# Patient Record
Sex: Male | Born: 2000 | Hispanic: Yes | Marital: Single | Smoking: Never smoker
Health system: Southern US, Community
[De-identification: ages and names within clinical notes are randomized; demographics above are authoritative.]

## PROBLEM LIST (undated history)

## (undated) DIAGNOSIS — J45909 Unspecified asthma, uncomplicated: Secondary | ICD-10-CM

## (undated) HISTORY — PX: MULTIPLE TOOTH EXTRACTIONS: SHX2053

## (undated) HISTORY — DX: Unspecified asthma, uncomplicated: J45.909

---
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
## 2020-03-18 ENCOUNTER — Ambulatory Visit: Payer: Medicaid Other

## 2020-03-18 NOTE — Progress Notes (Deleted)
    SUBJECTIVE:   CHIEF COMPLAINT / HPI:   Back pain:   PERTINENT  PMH / PSH: ***  OBJECTIVE:   There were no vitals taken for this visit.  ***  ASSESSMENT/PLAN:   No problem-specific Assessment & Plan notes found for this encounter.     Sandre Kitty, MD [facility]

## 2020-06-09 ENCOUNTER — Other Ambulatory Visit: Payer: Medicaid Other

## 2020-06-09 DIAGNOSIS — Z20822 Contact with and (suspected) exposure to covid-19: Secondary | ICD-10-CM | POA: Diagnosis not present

## 2020-06-10 ENCOUNTER — Ambulatory Visit: Payer: Medicaid Other

## 2020-06-10 LAB — NOVEL CORONAVIRUS, NAA: SARS-CoV-2, NAA: NOT DETECTED

## 2020-06-10 LAB — SARS-COV-2, NAA 2 DAY TAT

## 2020-06-17 NOTE — Progress Notes (Signed)
    SUBJECTIVE:   CHIEF COMPLAINT / HPI:   Back pain: Patient reports left-sided low back pain that started about-5 ago when he was working at his job and lifted a heavy box.  Patient reports he has had the same pain before but usually it goes away after 3 days.  He reports the pain is now a little bit better than when it started.  He has not tried any home remedies to help improve the pain.  Pain is worse with working when he has to pick up boxes, pain is better with relaxing/lying down.  He denies the pain radiating.  No urinary or bladder incontinence, no numbness/tingling in lower extremities, no weakness of lower extremities.  STI screening: Patient reports he would like to be screened today for STIs as a routine check.  Denies any concerning signs/symptoms.  PERTINENT  PMH / PSH:  Refugee status, seasonal allergies   OBJECTIVE:   BP 98/62   Pulse 74   Wt 212 lb (96.2 kg)   SpO2 98%    Physical exam: General: Well-appearing patient, no apparent distress Respiratory: Clear bilaterally, comfortable work of breathing Spine/Back: Normal upon inspection/no deformity, tenderness to palpation of left paravertebral spinal musculature and quadratus lumborum, normal strength/sensation appreciated to bilateral lower extremities   ASSESSMENT/PLAN:   Strain of lumbar region Patient with muscular strain of lumbar region after lifting heavy object.  No neurological changes to suggest nerve involvement/impingement.  Normal strength of bilateral lower extremities. -Recommended 1 tablet of Tylenol 500 mg with 1 tablet of ibuprofen 200 mg 3 times daily for the next 3 days as needed for pain, inflammation -Patient given prescription for Flexeril 5 mg to be taken at night before bedtime -Patient asked to stretch lumbar/oblique region, given demonstration of stretches-follow-up as needed, for worsening symptoms  Screen for STD (sexually transmitted disease) -Patient screened today for gonorrhea,  chlamydia, RPR, HIV -Follow-up results, treat as needed     Dollene Cleveland, DO [facility]

## 2020-06-18 ENCOUNTER — Ambulatory Visit (INDEPENDENT_AMBULATORY_CARE_PROVIDER_SITE_OTHER): Payer: Medicaid Other | Admitting: Family Medicine

## 2020-06-18 ENCOUNTER — Encounter: Payer: Self-pay | Admitting: Family Medicine

## 2020-06-18 ENCOUNTER — Ambulatory Visit: Payer: Medicaid Other | Admitting: Family Medicine

## 2020-06-18 ENCOUNTER — Other Ambulatory Visit (HOSPITAL_COMMUNITY)
Admission: RE | Admit: 2020-06-18 | Discharge: 2020-06-18 | Disposition: A | Discharge status: Home / Self Care | Payer: Medicaid Other | Source: Ambulatory Visit | Attending: Family Medicine | Admitting: Family Medicine

## 2020-06-18 ENCOUNTER — Other Ambulatory Visit: Payer: Self-pay

## 2020-06-18 VITALS — BP 98/62 | HR 74 | Wt 212.0 lb

## 2020-06-18 DIAGNOSIS — Z113 Encounter for screening for infections with a predominantly sexual mode of transmission: Secondary | ICD-10-CM | POA: Diagnosis not present

## 2020-06-18 DIAGNOSIS — S39012A Strain of muscle, fascia and tendon of lower back, initial encounter: Secondary | ICD-10-CM | POA: Insufficient documentation

## 2020-06-18 HISTORY — DX: Encounter for screening for infections with a predominantly sexual mode of transmission: Z11.3

## 2020-06-18 HISTORY — DX: Strain of muscle, fascia and tendon of lower back, initial encounter: S39.012A

## 2020-06-18 MED ORDER — CYCLOBENZAPRINE HCL 5 MG PO TABS: 5.0000 mg | ORAL_TABLET | Freq: Every day | ORAL | 0 refills | Status: DC

## 2020-06-18 NOTE — Assessment & Plan Note (Addendum)
-  Patient screened today for gonorrhea, chlamydia, RPR, HIV -Follow-up results, treat as needed

## 2020-06-18 NOTE — Patient Instructions (Addendum)
Thank you for coming in to see Elijah Sosa today! Please see below to review our plan for today's visit:  1. Take Flexeril at night as needed for back pain. Use ice / heat as needed to reduce pain. Stretch your back a couple times daily. Lift with your knees. You can take Tylenol 500mg  and Ibuprofen 200mg  together 3 times daily.  2. We are checking for Sexually Transmitted Infections today. We can call with results.  Please call the clinic at [phone] if your symptoms worsen or you have any concerns. It was our pleasure to serve you!   Dr. [facility]

## 2020-06-18 NOTE — Assessment & Plan Note (Signed)
Patient with muscular strain of lumbar region after lifting heavy object.  No neurological changes to suggest nerve involvement/impingement.  Normal strength of bilateral lower extremities. -Recommended 1 tablet of Tylenol 500 mg with 1 tablet of ibuprofen 200 mg 3 times daily for the next 3 days as needed for pain, inflammation -Patient given prescription for Flexeril 5 mg to be taken at night before bedtime -Patient asked to stretch lumbar/oblique region, given demonstration of stretches-follow-up as needed, for worsening symptoms

## 2020-06-21 LAB — URINE CYTOLOGY ANCILLARY ONLY
Chlamydia: NEGATIVE
Comment: NEGATIVE
Comment: NEGATIVE
Comment: NORMAL
Neisseria Gonorrhea: NEGATIVE
Trichomonas: NEGATIVE

## 2020-06-23 LAB — HIV ANTIBODY (ROUTINE TESTING W REFLEX): HIV Screen 4th Generation wRfx: NONREACTIVE

## 2020-06-23 LAB — RPR, QUANT+TP ABS (REFLEX)
Rapid Plasma Reagin, Quant: 1:1 {titer} — ABNORMAL HIGH
T Pallidum Abs: NONREACTIVE

## 2020-06-23 LAB — RPR: RPR Ser Ql: REACTIVE — AB

## 2020-07-08 ENCOUNTER — Encounter: Payer: Self-pay | Admitting: Family Medicine

## 2020-08-26 ENCOUNTER — Ambulatory Visit (HOSPITAL_COMMUNITY)
Admission: EM | Admit: 2020-08-26 | Discharge: 2020-08-26 | Disposition: A | Discharge status: Home / Self Care | Payer: Medicaid Other | Attending: Physician Assistant | Admitting: Physician Assistant

## 2020-08-26 ENCOUNTER — Encounter (HOSPITAL_COMMUNITY): Payer: Self-pay

## 2020-08-26 ENCOUNTER — Other Ambulatory Visit: Payer: Self-pay

## 2020-08-26 DIAGNOSIS — R109 Unspecified abdominal pain: Secondary | ICD-10-CM | POA: Diagnosis not present

## 2020-08-26 DIAGNOSIS — M546 Pain in thoracic spine: Secondary | ICD-10-CM

## 2020-08-26 LAB — POCT URINALYSIS DIPSTICK, ED / UC
Bilirubin Urine: NEGATIVE
Glucose, UA: NEGATIVE mg/dL
Hgb urine dipstick: NEGATIVE
Ketones, ur: NEGATIVE mg/dL
Leukocytes,Ua: NEGATIVE
Nitrite: NEGATIVE
Protein, ur: NEGATIVE mg/dL
Specific Gravity, Urine: 1.025 (ref 1.005–1.030)
Urobilinogen, UA: 0.2 mg/dL (ref 0.0–1.0)
pH: 6.5 (ref 5.0–8.0)

## 2020-08-26 MED ORDER — DICLOFENAC SODIUM 50 MG PO TBEC: 50.0000 mg | DELAYED_RELEASE_TABLET | Freq: Two times a day (BID) | ORAL | 0 refills | Status: DC | PRN

## 2020-08-26 MED ORDER — BACLOFEN 10 MG PO TABS: 10.0000 mg | ORAL_TABLET | Freq: Two times a day (BID) | ORAL | 0 refills | Status: DC | PRN

## 2020-08-26 NOTE — ED Triage Notes (Addendum)
Pt c/o left flank pain X 2 days. Pt states it hurts when he is walking to sitting. Pt states when he takes a deep breath the pain becomes stronger.

## 2020-08-26 NOTE — Discharge Instructions (Addendum)
Your urine was normal.  I suspect you have a muscle strain.  I have called in diclofenac (Voltaren) which is like ibuprofen and you should avoid NSAIDs including aspirin, ibuprofen, naproxen while taking this medication.  I have also called in baclofen to help with muscle relaxation but this will make you sleepy she should not drive or drink alcohol with this medication as drowsiness is a common side effect.

## 2020-08-26 NOTE — ED Provider Notes (Signed)
[facility]    CSN: [id number] Arrival date & time: 08/26/20  1234      History   Chief Complaint Chief Complaint  Patient presents with  . Back Pain  . Abdominal Pain    HPI Elijah Andrae Claunch is a 20 y.o. male.   Patient presents today with a 1 day history of left-sided flank/abdominal pain.  He denies any known injury or increase in activity prior to symptom onset.  Reports pain is 10 on a 0-10 pain scale, localized to left flank with radiation towards abdomen, described as sharp, no aggravating or alleviating factors identified.  He has not tried any over-the-counter medications for symptom management.  He denies previous injury or back surgery.  He denies history of nephrolithiasis, single kidney, recent abdominal procedure, UTI.  He denies any urinary symptoms including fever, frequency, urgency, nausea, vomiting.     Past Medical History:  Diagnosis Date  . Asthma    Childhood asthma.  Has not used/needed inhalers for 4-5 years.  Did have multiple hospitalizations as young child in [place]    Patient Active Problem List   Diagnosis Date Noted  . Strain of lumbar region 06/18/2020  . Screen for STD (sexually transmitted disease) 06/18/2020  . Seasonal allergies 09/05/2019  . Viral respiratory illness 05/26/2019  . Encounter by telehealth for suspected COVID-19 05/20/2019  . Acute post-traumatic headache, not intractable 07/04/2018  . Viral gastroenteritis 09/12/2017  . Nausea without vomiting 09/12/2017  . Sleeping difficulty 01/03/2017  . Olecranon bursitis of left elbow 02/14/2016  . Hordeolum externum (stye) 11/04/2015  . Tongue, fissured 02/20/2015  . Musculoskeletal chest pain 01/06/2015  . Constipation 10/23/2014  . 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 Bacteriuria 10/14/2014  . Refugee health examination 08/04/2014  . Abdominal pain 08/04/2014  . Dysuria 08/04/2014    Past Surgical History:  Procedure Laterality Date  . MULTIPLE TOOTH  EXTRACTIONS         Home Medications    Prior to Admission medications   Medication Sig Start Date End Date Taking? Authorizing Provider  baclofen (LIORESAL) 10 MG tablet Take 1 tablet (10 mg total) by mouth 2 (two) times daily as needed for muscle spasms. 08/26/20  Yes Tomie Spizzirri, Noberto Retort, PA-C  diclofenac (VOLTAREN) 50 MG EC tablet Take 1 tablet (50 mg total) by mouth 2 (two) times daily as needed. 08/26/20  Yes Idil Maslanka K, PA-C  cetirizine (ZYRTEC) 10 MG tablet Take 1 tablet (10 mg total) by mouth daily. 11/27/19   Dahlia Byes A, NP  docusate sodium (COLACE) 100 MG capsule Take 1 capsule (100 mg total) by mouth 2 (two) times daily. 10/23/14   Tobey Grim, MD  famotidine (PEPCID) 20 MG tablet Take 1 tablet (20 mg total) by mouth 2 (two) times daily as needed (Itching, hives). 12/10/18   Ward, Chase Picket, PA-C  fluticasone (FLONASE) 50 MCG/ACT nasal spray Place 1 spray into both nostrils daily. 1 spray in each nostril every day 11/27/19   Dahlia Byes A, NP  guaiFENesin 200 MG tablet Take 1 tablet (200 mg total) by mouth every 4 (four) hours as needed for cough or to loosen phlegm. 05/26/19   Dana Allan, MD  magic mouthwash w/lidocaine SOLN Take 5 mLs by mouth 3 (three) times daily as needed for mouth pain. 09/20/15   Nani Ravens, MD  polyethylene glycol powder [facility]) powder Take 17 g by mouth 2 (two) times daily. Until daily soft stools  OTC 01/01/17   Sharilyn Sites  Judie Petit PA-C    Family History History reviewed. No pertinent family history.  Social History Social History   Tobacco Use  . Smoking status: Never Smoker  . Smokeless tobacco: Never Used     Allergies   Patient has no known allergies.   Review of Systems Review of Systems  Constitutional: Positive for activity change. Negative for appetite change, fatigue and fever.  Respiratory: Negative for cough and shortness of breath.   Cardiovascular: Negative for chest pain.  Gastrointestinal: Negative for  abdominal pain, blood in stool, constipation, diarrhea, nausea and vomiting.  Genitourinary: Positive for flank pain (left flank). Negative for difficulty urinating, dysuria, frequency, hematuria, penile discharge and urgency.  Musculoskeletal: Positive for back pain. Negative for arthralgias and myalgias.  Neurological: Negative for dizziness, light-headedness and headaches.     Physical Exam Triage Vital Signs ED Triage Vitals  Enc Vitals Group     BP 08/26/20 1414 120/78     Pulse Rate 08/26/20 1414 88     Resp 08/26/20 1414 20     Temp 08/26/20 1414 98.3 F (36.8 C)     Temp Source 08/26/20 1414 Oral     SpO2 08/26/20 1414 98 %     Weight --      Height --      Head Circumference --      Peak Flow --      Pain Score 08/26/20 1413 10     Pain Loc --      Pain Edu? --      Excl. in [place]? --    No data found.  Updated Vital Signs BP 120/78 (BP Location: Left Arm)   Pulse 88   Temp 98.3 F (36.8 C) (Oral)   Resp 20   SpO2 98%   Visual Acuity Right Eye Distance:   Left Eye Distance:   Bilateral Distance:    Right Eye Near:   Left Eye Near:    Bilateral Near:     Physical Exam Vitals reviewed.  Constitutional:      General: He is awake.     Appearance: Normal appearance. He is normal weight. He is not ill-appearing.     Comments: Very pleasant male appears stated age in no acute distress  HENT:     Head: Normocephalic and atraumatic.  Cardiovascular:     Rate and Rhythm: Normal rate and regular rhythm.     Heart sounds: No murmur heard.   Pulmonary:     Effort: Pulmonary effort is normal.     Breath sounds: Normal breath sounds. No stridor. No wheezing, rhonchi or rales.  Abdominal:     General: Bowel sounds are normal.     Palpations: Abdomen is soft.     Tenderness: There is abdominal tenderness in the left lower quadrant. There is no right CVA tenderness, left CVA tenderness, guarding or rebound.     Comments: Tenderness to palpation of left lower  quadrant with radiation to left lateral abdomen.  Musculoskeletal:     Cervical back: No tenderness or bony tenderness.     Thoracic back: Tenderness present. No spasms or bony tenderness.     Lumbar back: No tenderness or bony tenderness.  Neurological:     Mental Status: He is alert.  Psychiatric:        Behavior: Behavior is cooperative.      UC Treatments / Results  Labs (all labs ordered are listed, but only abnormal results are displayed) Labs Reviewed  POCT URINALYSIS DIPSTICK,  ED / UC    EKG   Radiology No results found.  Procedures Procedures (including critical care time)  Medications Ordered in UC Medications - No data to display  Initial Impression / Assessment and Plan / UC Course  I have reviewed the triage vital signs and the nursing notes.  Pertinent labs & imaging results that were available during my care of the patient were reviewed by me and considered in my medical decision making (see chart for details).     UA was normal with no hematuria so low suspicion for nephrolithiasis given no CVA tenderness and normal urine.  Suspect muscle strain given etiology and patient was prescribed Voltaren 50 mg twice daily with instruction to take additional NSAIDs.  He was prescribed baclofen with instruction not to drive or drink alcohol this medication as drowsiness is a common side effect.  He can use heat and rest for additional symptom relief.  Strict return precautions given to which patient expressed understanding.  Final Clinical Impressions(s) / UC Diagnoses   Final diagnoses:  Left flank pain  Acute left-sided thoracic back pain     Discharge Instructions     Your urine was normal.  I suspect you have a muscle strain.  I have called in diclofenac (Voltaren) which is like ibuprofen and you should avoid NSAIDs including aspirin, ibuprofen, naproxen while taking this medication.  I have also called in baclofen to help with muscle relaxation but this  will make you sleepy she should not drive or drink alcohol with this medication as drowsiness is a common side effect.    ED Prescriptions    Medication Sig Dispense Auth. Provider   diclofenac (VOLTAREN) 50 MG EC tablet Take 1 tablet (50 mg total) by mouth 2 (two) times daily as needed. 20 tablet Sherryann Frese K, PA-C   baclofen (LIORESAL) 10 MG tablet Take 1 tablet (10 mg total) by mouth 2 (two) times daily as needed for muscle spasms. 20 each Namita Yearwood, Noberto Retort, PA-C     PDMP not reviewed this encounter.   Jeani Hawking, PA-C 08/26/20 1513

## 2020-09-21 ENCOUNTER — Other Ambulatory Visit: Payer: Self-pay

## 2020-09-21 ENCOUNTER — Ambulatory Visit (INDEPENDENT_AMBULATORY_CARE_PROVIDER_SITE_OTHER): Payer: Medicaid Other | Admitting: Family Medicine

## 2020-09-21 VITALS — BP 114/80 | HR 96

## 2020-09-21 DIAGNOSIS — J988 Other specified respiratory disorders: Secondary | ICD-10-CM | POA: Diagnosis not present

## 2020-09-21 MED ORDER — CETIRIZINE HCL 10 MG PO TABS: 10.0000 mg | ORAL_TABLET | Freq: Every day | ORAL | 11 refills | Status: DC

## 2020-09-21 MED ORDER — FLUTICASONE PROPIONATE 50 MCG/ACT NA SUSP: 1.0000 | Freq: Every day | NASAL | 0 refills | Status: DC

## 2020-09-21 NOTE — Progress Notes (Signed)
    SUBJECTIVE:   CHIEF COMPLAINT / HPI:   Klay is a 20 yo M who presents to [facility] for the following.   Upper respiratory symptoms Symptoms of coryza, rhinorrhea, and sore throat started this past Sunday. He has a knew symptom today of headache mainly with sneezing. He has no known sick contacts. He denies any GI upset such as diarrhea or vomiting. No fever. He is not vaccinated against COVID. He has not tried to take anything for symptom relief.   PERTINENT  PMH / PSH: Hx of suspected COVID 1 year prior, Hx of seasonal allergies.   OBJECTIVE:   BP 114/80   Pulse 96   SpO2 99%   General: Appears unwell, no acute distress. Age appropriate. HEENT: Normocephalic. White sclera. patent nares w/ evidence of rhinorrhea. Normal oropharynx. Neck is supple. No lymphadenopathy.  Cardiac: RRR, normal heart sounds, no murmurs Respiratory: CTAB, normal effort  ASSESSMENT/PLAN:   Congestion of upper airway Experiencing upper respiratory symptoms for 3 days. Suspected covid with non-vaccination status. Will obtain PCR. Could very well be a viral URI that will resolve with supportive care. Also question of seasonal allergies. -COVID PCR -Flonase -Zyrtec -Plenty of fluids and rest as needed -F/u if symptoms worsen or fail to improve     Lavonda Jumbo, DO [facility]

## 2020-09-21 NOTE — Patient Instructions (Signed)
It was wonderful to see you today.  Please bring ALL of your medications with you to every visit.   Today we talked about:  You have cough, congestion and headache which is likely due to a viral illness.  We tested you for COVID and we will notify you of the results either by phone or MyChart.  In the meantime for relief you may take Tylenol and ibuprofen for headache, I will send in Flonase and Zyrtec for your congestion.  Please call the clinic at [phone] if your symptoms worsen or you have any concerns. It was our pleasure to serve you.  Dr. Salvadore Dom

## 2020-09-22 LAB — NOVEL CORONAVIRUS, NAA: SARS-CoV-2, NAA: NOT DETECTED

## 2020-09-22 LAB — SARS-COV-2, NAA 2 DAY TAT

## 2020-09-23 NOTE — Assessment & Plan Note (Addendum)
Experiencing upper respiratory symptoms for 3 days. Suspected covid with non-vaccination status. Will obtain PCR. Could very well be a viral URI that will resolve with supportive care. Also question of seasonal allergies. -COVID PCR -Flonase -Zyrtec -Plenty of fluids and rest as needed -F/u if symptoms worsen or fail to improve

## 2021-03-29 ENCOUNTER — Ambulatory Visit (HOSPITAL_COMMUNITY)
Admission: EM | Admit: 2021-03-29 | Discharge: 2021-03-29 | Disposition: A | Discharge status: Home / Self Care | Payer: Medicaid Other

## 2021-03-29 ENCOUNTER — Other Ambulatory Visit: Payer: Self-pay

## 2021-03-29 ENCOUNTER — Encounter (HOSPITAL_COMMUNITY): Payer: Self-pay

## 2021-03-29 DIAGNOSIS — J069 Acute upper respiratory infection, unspecified: Secondary | ICD-10-CM | POA: Diagnosis not present

## 2021-03-29 DIAGNOSIS — Z8709 Personal history of other diseases of the respiratory system: Secondary | ICD-10-CM | POA: Diagnosis not present

## 2021-03-29 MED ORDER — PROMETHAZINE-DM 6.25-15 MG/5ML PO SYRP: 5.0000 mL | ORAL_SOLUTION | Freq: Four times a day (QID) | ORAL | 0 refills | Status: DC | PRN

## 2021-03-29 MED ORDER — IBUPROFEN 800 MG PO TABS: 800.0000 mg | ORAL_TABLET | Freq: Three times a day (TID) | ORAL | 0 refills | Status: DC

## 2021-03-29 NOTE — Discharge Instructions (Addendum)
-  Promethazine DM cough syrup for congestion/cough. This could make you drowsy, so take at night before bed. -Mucinex during the day -You can take Tylenol up to 1000 mg 3 times daily, and ibuprofen up to 800 mg 3 times daily with food.  You can take these together, or alternate every 3-4 hours. -With a virus, you're typically contagious for 5-7 days, or as long as you're having fevers.  -Drink plenty of fluids

## 2021-03-29 NOTE — ED Triage Notes (Signed)
Pt reports fever, headache, bilateral eye pressure and nasal congestion x 1 day.

## 2021-03-29 NOTE — ED Provider Notes (Signed)
[facility]    CSN: [id number] Arrival date & time: 03/29/21  1651      History   Chief Complaint Chief Complaint  Patient presents with   Fever   Headache   Congestive Heart Failure    HPI Elijah Sosa is a 20 y.o. male presenting with viral syndrome x2 days. Medical history childhood asthma, has not required inhaler in years. Here today with mom.  Describes 2 days of subjective fevers and chills; monitored this 1 day ago and it was up to 100 F.  Also with throbbing headaches behind eyes, nasal congestion, sneezing, nonproductive cough, malaise.  Denies nausea, vomiting, diarrhea, constipation, abdominal pain; he is tolerating fluids and food.  Ibuprofen helped minimally, last dose 7 hours ago.  Denies known sick contacts.  Requesting work note for 1 week.    HPI  Past Medical History:  Diagnosis Date   Asthma    Childhood asthma.  Has not used/needed inhalers for 4-5 years.  Did have multiple hospitalizations [place] young child [place]    Patient Active Problem List   Diagnosis Date Noted   Strain of lumbar region 06/18/2020   Screen for STD (sexually transmitted disease) 06/18/2020   Seasonal allergies 09/05/2019   Congestion of upper airway 05/26/2019   Encounter by telehealth for suspected COVID-19 05/20/2019   Acute post-traumatic headache, not intractable 07/04/2018   Viral gastroenteritis 09/12/2017   Nausea without vomiting 09/12/2017   Sleeping difficulty 01/03/2017   Olecranon bursitis of left elbow 02/14/2016   Hordeolum externum (stye) 11/04/2015   Tongue, fissured 02/20/2015   Musculoskeletal chest pain 01/06/2015   Constipation 10/23/2014   Plumbism 10/23/2014   Bacteriuria 10/14/2014   Refugee health examination 08/04/2014   Abdominal pain 08/04/2014   Dysuria 08/04/2014    Past Surgical History:  Procedure Laterality Date   MULTIPLE TOOTH EXTRACTIONS         Home Medications    Prior to Admission medications    Medication Sig Start Date End Date Taking? Authorizing Provider  ibuprofen (ADVIL) 800 MG tablet Take 1 tablet (800 mg total) by mouth 3 (three) times daily. 03/29/21  Yes Rhys MartiniGraham, Cristabel Bicknell E, PA-C  promethazine-dextromethorphan (PROMETHAZINE-DM) 6.25-15 MG/5ML syrup Take 5 mLs by mouth 4 (four) times daily as needed for cough. 03/29/21  Yes Rhys MartiniGraham, Filbert Craze E, PA-C  baclofen (LIORESAL) 10 MG tablet Take 1 tablet (10 mg total) by mouth 2 (two) times daily as needed for muscle spasms. 08/26/20   Raspet, Noberto RetortErin K, PA-C  cetirizine (ZYRTEC) 10 MG tablet Take 1 tablet (10 mg total) by mouth daily. 09/21/20   Autry-Lott, Randa EvensSimone, DO  diclofenac (VOLTAREN) 50 MG EC tablet Take 1 tablet (50 mg total) by mouth 2 (two) times daily as needed. 08/26/20   Raspet, Noberto RetortErin K, PA-C  docusate sodium (COLACE) 100 MG capsule Take 1 capsule (100 mg total) by mouth 2 (two) times daily. 10/23/14   Tobey GrimWalden, Jeffrey H, MD  famotidine (PEPCID) 20 MG tablet Take 1 tablet (20 mg total) by mouth 2 (two) times daily as needed (Itching, hives). 12/10/18   Ward, Chase PicketJaime Pilcher, PA-C  fluticasone (FLONASE) 50 MCG/ACT nasal spray Place 1 spray into both nostrils daily. 1 spray in each nostril every day 09/21/20   Autry-Lott, Randa EvensSimone, DO  guaiFENesin 200 MG tablet Take 1 tablet (200 mg total) by mouth every 4 (four) hours as needed for cough or to loosen phlegm. 05/26/19   Dana AllanWalsh, Tanya, MD  magic mouthwash w/lidocaine SOLN Take 5  mLs by mouth 3 (three) times daily as needed for mouth pain. 09/20/15   Leone Brand, MD  polyethylene glycol powder [facility]) powder Take 17 g by mouth 2 (two) times daily. Until daily soft stools  OTC 01/01/17   Larene Pickett, PA-C    Family History History reviewed. No pertinent family history.  Social History Social History   Tobacco Use   Smoking status: Never   Smokeless tobacco: Never  Substance Use Topics   Alcohol use: Never   Drug use: Never     Allergies   Patient has no known  allergies.   Review of Systems Review of Systems  Constitutional:  Positive for chills. Negative for appetite change and fever.  HENT:  Positive for congestion. Negative for ear pain, rhinorrhea, sinus pressure, sinus pain and sore throat.   Eyes:  Negative for redness and visual disturbance.  Respiratory:  Positive for cough. Negative for chest tightness, shortness of breath and wheezing.   Cardiovascular:  Negative for chest pain and palpitations.  Gastrointestinal:  Negative for abdominal pain, constipation, diarrhea, nausea and vomiting.  Genitourinary:  Negative for dysuria, frequency and urgency.  Musculoskeletal:  Negative for myalgias.  Neurological:  Negative for dizziness, weakness and headaches.  Psychiatric/Behavioral:  Negative for confusion.   All other systems reviewed and are negative.   Physical Exam Triage Vital Signs ED Triage Vitals  Enc Vitals Group     BP 03/29/21 1802 133/87     Pulse Rate 03/29/21 1802 (!) 101     Resp 03/29/21 1802 18     Temp 03/29/21 1802 98.9 F (37.2 C)     Temp src --      SpO2 03/29/21 1802 95 %     Weight --      Height --      Head Circumference --      Peak Flow --      Pain Score 03/29/21 1800 5     Pain Loc --      Pain Edu? --      Excl. in [place]? --    No data found.  Updated Vital Signs BP 133/87 (BP Location: Right Arm)   Pulse (!) 101   Temp 98.9 F (37.2 C)   Resp 18   SpO2 95%   Visual Acuity Right Eye Distance:   Left Eye Distance:   Bilateral Distance:    Right Eye Near:   Left Eye Near:    Bilateral Near:     Physical Exam Vitals reviewed.  Constitutional:      General: He is not in acute distress.    Appearance: Normal appearance. He is not ill-appearing.  HENT:     Head: Normocephalic and atraumatic.     Right Ear: Tympanic membrane, ear canal and external ear normal. No tenderness. No middle ear effusion. There is no impacted cerumen. Tympanic membrane is not perforated, erythematous,  retracted or bulging.     Left Ear: Tympanic membrane, ear canal and external ear normal. No tenderness.  No middle ear effusion. There is no impacted cerumen. Tympanic membrane is not perforated, erythematous, retracted or bulging.     Nose: Nose normal. No congestion.     Mouth/Throat:     Mouth: Mucous membranes are moist.     Pharynx: Uvula midline. No oropharyngeal exudate or posterior oropharyngeal erythema.  Eyes:     Extraocular Movements: Extraocular movements intact.     Pupils: Pupils are equal, round, and reactive to light.  Cardiovascular:     Rate and Rhythm: Normal rate and regular rhythm.     Heart sounds: Normal heart sounds.  Pulmonary:     Effort: Pulmonary effort is normal.     Breath sounds: Normal breath sounds. No decreased breath sounds, wheezing, rhonchi or rales.  Abdominal:     Palpations: Abdomen is soft.     Tenderness: There is no abdominal tenderness. There is no guarding or rebound.  Lymphadenopathy:     Cervical: No cervical adenopathy.     Right cervical: No superficial cervical adenopathy.    Left cervical: No superficial cervical adenopathy.  Neurological:     General: No focal deficit present.     Mental Status: He is alert and oriented to person, place, and time.  Psychiatric:        Mood and Affect: Mood normal.        Behavior: Behavior normal.        Thought Content: Thought content normal.        Judgment: Judgment normal.     UC Treatments / Results  Labs (all labs ordered are listed, but only abnormal results are displayed) Labs Reviewed - No data to display  EKG   Radiology No results found.  Procedures Procedures (including critical care time)  Medications Ordered in UC Medications - No data to display  Initial Impression / Assessment and Plan / UC Course  I have reviewed the triage vital signs and the nursing notes.  Pertinent labs & imaging results that were available during my care of the patient were reviewed by me  and considered in my medical decision making (see chart for details).     This patient is a very pleasant 20 y.o. year old male presenting with suspected influenza. Today this pt is afebrile nontachycardic nontachypneic, oxygenating well on room air, no wheezes rhonchi or rales.   Out of tamiflu window, will defer rapid influenza testing as symptoms are mild.  History childhood asthma; this is currently well controlled on no medications and he declines albuterol inhaler today.  Promethzine DM, Mucinex, ibuprofen 800mg .   ED return precautions discussed. Patient and mom verbalizes understanding and agreement.   Final Clinical Impressions(s) / UC Diagnoses   Final diagnoses:  Viral URI with cough  History of asthma     Discharge Instructions      -Promethazine DM cough syrup for congestion/cough. This could make you drowsy, so take at night before bed. -Mucinex during the day -You can take Tylenol up to 1000 mg 3 times daily, and ibuprofen up to 800 mg 3 times daily with food.  You can take these together, or alternate every 3-4 hours. -With a virus, you're typically contagious for 5-7 days, or as long as you're having fevers.  -Drink plenty of fluids     ED Prescriptions     Medication Sig Dispense Auth. Provider   promethazine-dextromethorphan (PROMETHAZINE-DM) 6.25-15 MG/5ML syrup Take 5 mLs by mouth 4 (four) times daily as needed for cough. 118 mL 07-17-1990, PA-C   ibuprofen (ADVIL) 800 MG tablet Take 1 tablet (800 mg total) by mouth 3 (three) times daily. 21 tablet Rhys Martini, PA-C      PDMP not reviewed this encounter.   Rhys Martini, PA-C 03/29/21 1910

## 2021-11-29 IMAGING — CR DG CHEST 2V
2 series · 2 of 2 positions shown · non-contrast
Comparison: None.

CLINICAL DATA: Shortness of breath with chest pain

EXAM:
CHEST - 2 VIEW

[chest pa]
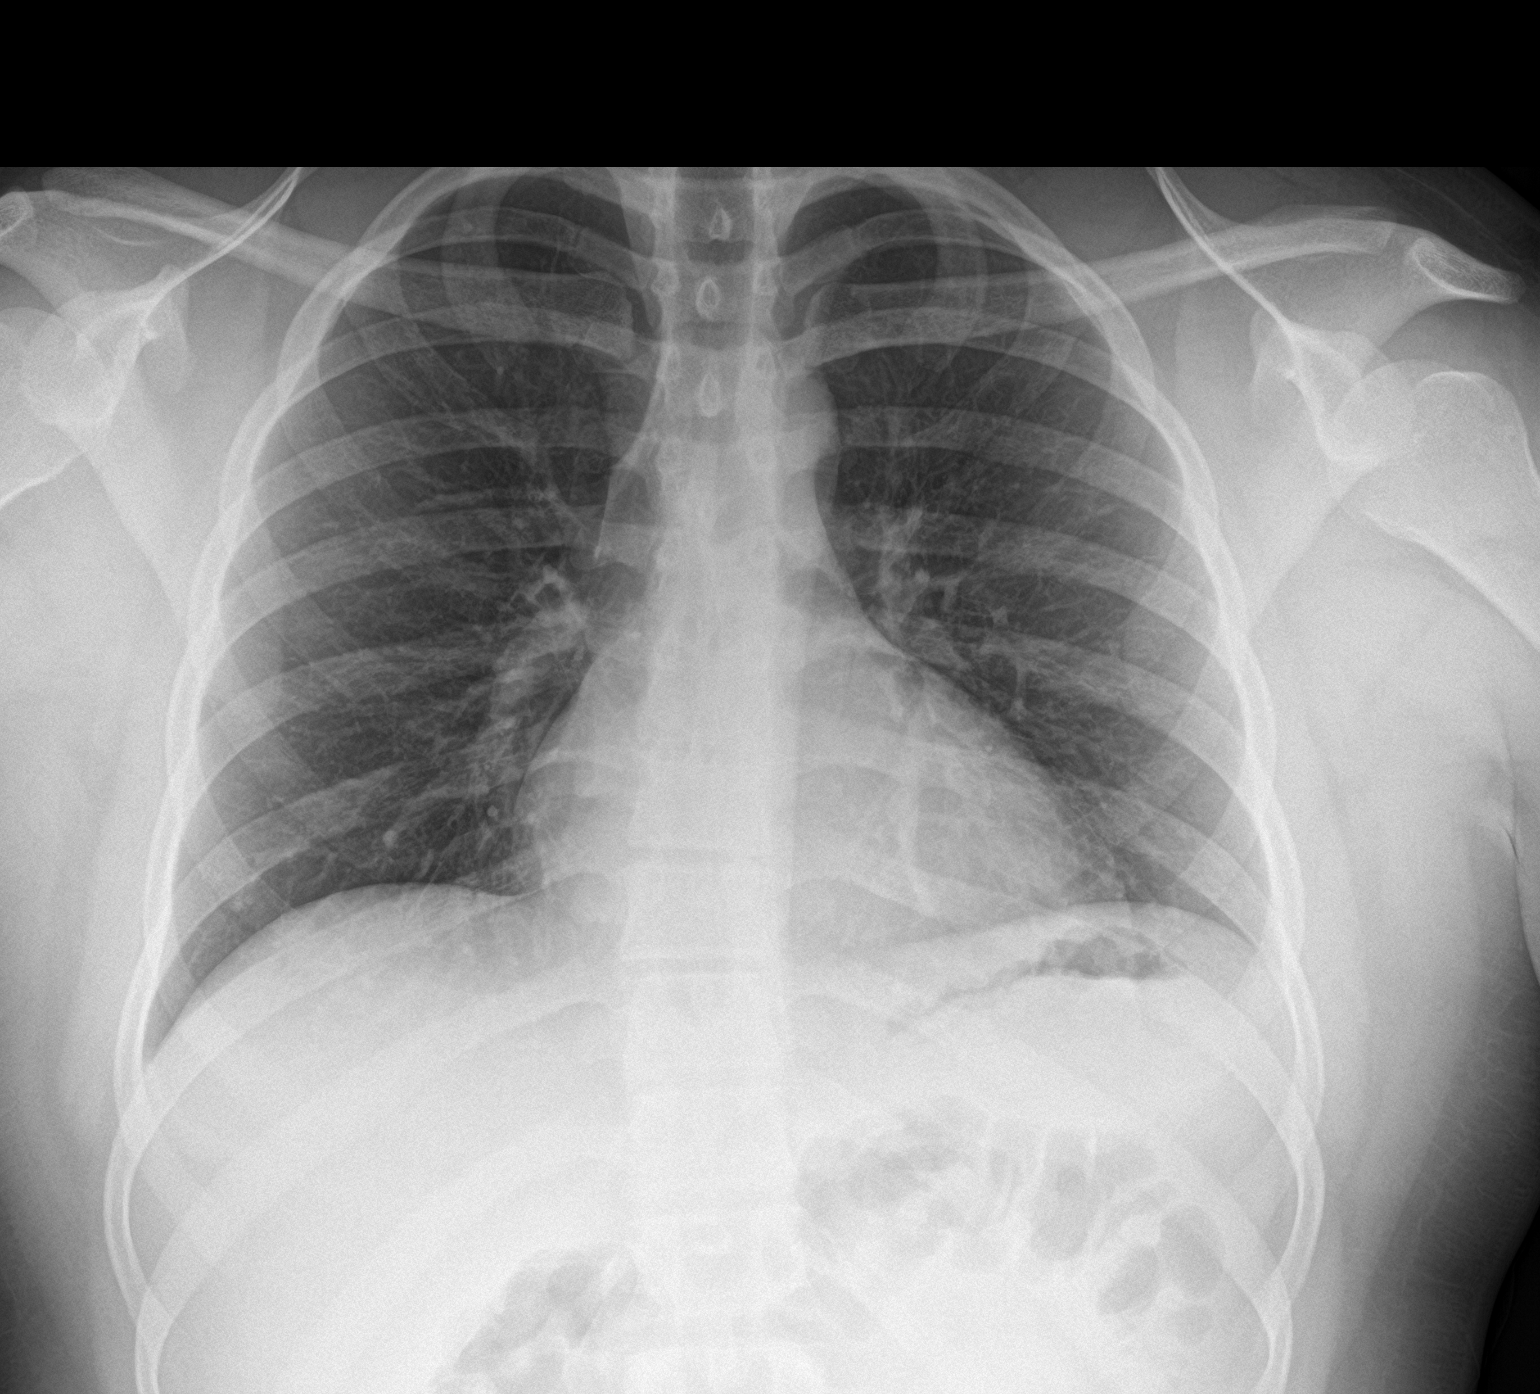

[chest lat]
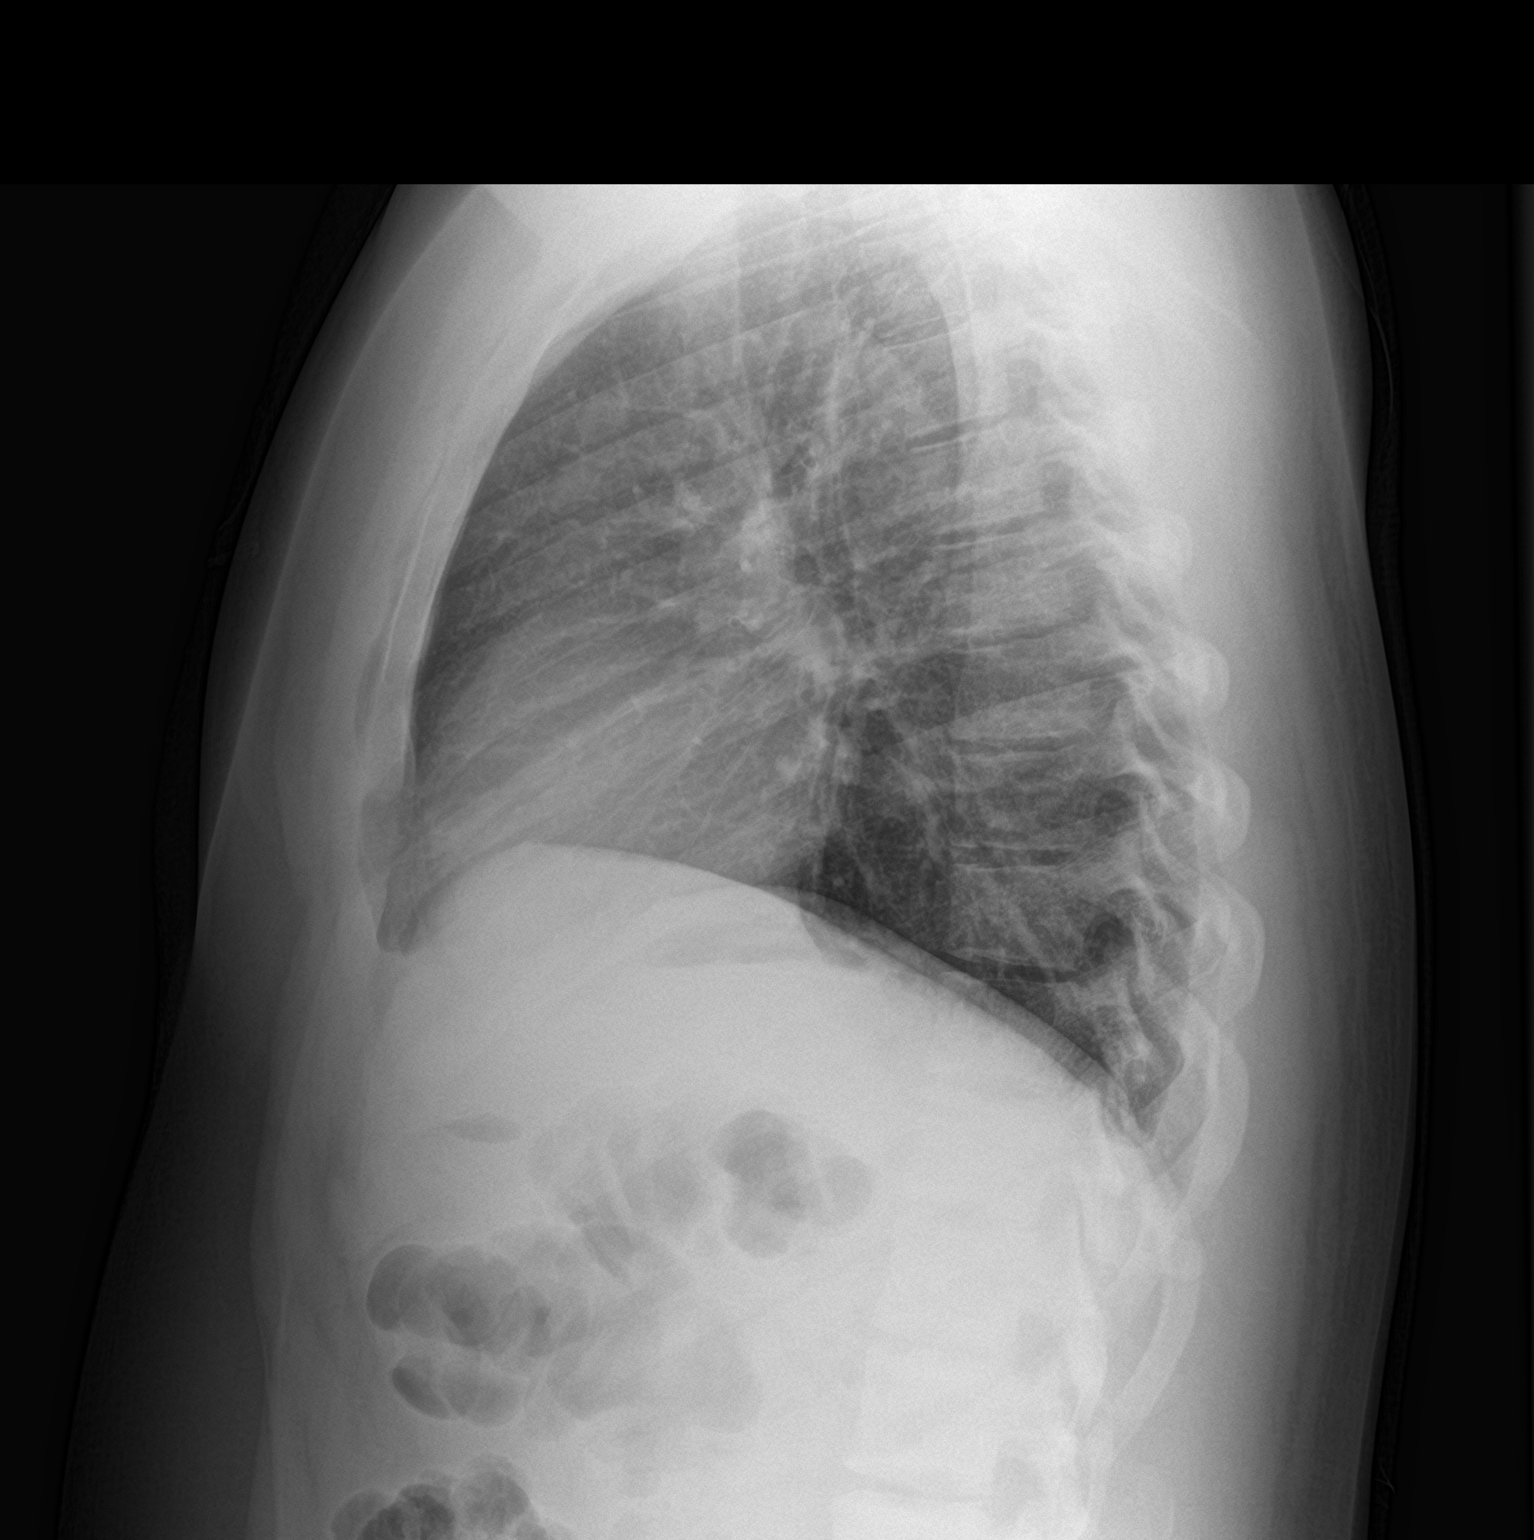

[2 of 2 positions shown; findings below may reference images not displayed]

FINDINGS: The heart size and mediastinal contours are within normal limits.
Both lungs are clear. The visualized skeletal structures are
unremarkable.
IMPRESSION: No active cardiopulmonary disease.

## 2021-12-26 ENCOUNTER — Ambulatory Visit: Payer: Medicaid Other | Admitting: Family Medicine

## 2022-04-27 ENCOUNTER — Ambulatory Visit (INDEPENDENT_AMBULATORY_CARE_PROVIDER_SITE_OTHER): Payer: Medicaid Other | Admitting: Student

## 2022-04-27 VITALS — BP 120/84 | HR 94 | Temp 98.8°F | Ht 66.0 in | Wt 221.4 lb

## 2022-04-27 DIAGNOSIS — J069 Acute upper respiratory infection, unspecified: Secondary | ICD-10-CM

## 2022-04-27 DIAGNOSIS — J302 Other seasonal allergic rhinitis: Secondary | ICD-10-CM

## 2022-04-27 DIAGNOSIS — J988 Other specified respiratory disorders: Secondary | ICD-10-CM | POA: Diagnosis not present

## 2022-04-27 HISTORY — DX: Acute upper respiratory infection, unspecified: J06.9

## 2022-04-27 MED ORDER — CETIRIZINE HCL 10 MG PO TABS: 10.0000 mg | ORAL_TABLET | Freq: Every day | ORAL | 11 refills | Status: DC

## 2022-04-27 NOTE — Assessment & Plan Note (Signed)
Zyrtec refilled for possible allergies which could be causing his watery itchy eyes although they are not watery or itchy today.

## 2022-04-27 NOTE — Assessment & Plan Note (Signed)
Symptoms most consistent with viral URI. Pt is afebrile with clear lung sounds and breathing comfortably on room air. Although it will not change management he would like COVID and flu testing.  -COVID and flu testing sent out -Handout on URI was given which included supportive care and return precautions which were also discussed

## 2022-04-27 NOTE — Patient Instructions (Signed)
It was great to see you! Thank you for allowing me to participate in your care!   Our plans for today:  -For your itchy watery eyes I sent in a prescription for Zyrtec which you can take once a day.  If this does not help, please let us know. -Today, I believe you have an upper respiratory infection.  Please see the handout on upper respiratory infections, supportive care, and when to return if needed. -We have sent out a test for flu and COVID.  We are checking some labs today, I will call you if they are abnormal will send you a MyChart message or a letter if they are normal.  If you do not hear about your labs in the next 2 weeks please let us know.  Take care and seek immediate care sooner if you develop any concerns.   Dr. Erick Alley, DO [facility]

## 2022-04-27 NOTE — Progress Notes (Signed)
    SUBJECTIVE:   CHIEF COMPLAINT / HPI:   Cold like symptoms Symptoms started about 2 days ago with sore throat, headache, rhinorrhea, eye pain, dry cough. No fever, no decreased appetite, no body aches. He has taken ibuprofen once yesterday. No known sick contacts.   Watery itching eyes Pt states he occasionally has watery itchy eyes for several years. They are more watery when its cold outside.   PERTINENT  PMH / PSH: None  OBJECTIVE:   BP 120/84   Pulse 94   Temp 98.8 F (37.1 C)   Ht 5\' 6"  (1.676 m)   Wt 221 lb 6.4 oz (100.4 kg)   SpO2 97%   BMI 35.73 kg/m    General: NAD, pleasant, able to participate in exam HEENT: White sclera, clear conjunctiva, MMM, no erythema or exudate of nasopharynx Cardiac: RRR, no murmurs. Respiratory: CTAB, normal effort, No wheezes, rales or rhonchi Skin: warm and dry Neuro: alert, no obvious focal deficits Psych: Normal affect and mood  ASSESSMENT/PLAN:   Upper respiratory tract infection Symptoms most consistent with viral URI. Pt is afebrile with clear lung sounds and breathing comfortably on room air. Although it will not change management he would like COVID and flu testing.  -COVID and flu testing sent out -Handout on URI was given which included supportive care and return precautions which were also discussed   Seasonal allergies Zyrtec refilled for possible allergies which could be causing his watery itchy eyes although they are not watery or itchy today.    Dr. , DO [facility]

## 2022-04-29 LAB — COVID-19, FLU A+B NAA
Influenza A, NAA: NOT DETECTED
Influenza B, NAA: NOT DETECTED
SARS-CoV-2, NAA: NOT DETECTED

## 2022-05-01 ENCOUNTER — Encounter: Payer: Self-pay | Admitting: Student

## 2022-05-29 ENCOUNTER — Ambulatory Visit: Payer: Medicaid Other | Admitting: Family Medicine

## 2022-05-29 ENCOUNTER — Encounter: Payer: Self-pay | Admitting: Student

## 2022-05-29 ENCOUNTER — Ambulatory Visit: Payer: Medicaid Other | Admitting: Student

## 2022-05-29 VITALS — BP 106/64 | HR 102 | Temp 100.0°F | Wt 223.2 lb

## 2022-05-29 DIAGNOSIS — J029 Acute pharyngitis, unspecified: Secondary | ICD-10-CM

## 2022-05-29 DIAGNOSIS — R509 Fever, unspecified: Secondary | ICD-10-CM | POA: Diagnosis not present

## 2022-05-29 LAB — POC SOFIA 2 FLU + SARS ANTIGEN FIA
Influenza A, POC: NEGATIVE
Influenza B, POC: NEGATIVE
SARS Coronavirus 2 Ag: NEGATIVE

## 2022-05-29 NOTE — Patient Instructions (Signed)
It was wonderful to meet you today. Thank you for allowing me to be a part of your care. Below is a short summary of what we discussed at your visit today:  Your symptoms are more consistent with viral upper respiratory infection.  Your COVID and flu test today was negative.  I recommend tylenol or ibuprofen for fever.  Make sure you stay hydrated with water or Gatorade.   Please bring all of your medications to every appointment!  If you have any questions or concerns, please do not hesitate to contact us via phone or MyChart message.   Alen Bleacher, MD [facility]

## 2022-05-29 NOTE — Progress Notes (Signed)
    SUBJECTIVE:   CHIEF COMPLAINT / HPI:   Patient is a 22 year old male presenting today with possible upper respiratory infection. Per patient symptom started with chills and sore throat yesterday.  Reports low grade fever treated with tylenol which provided no relieve.  Associated symptoms include fatigue and body ache. No cough, or running nose He has had decreased appetite but drinking adequately. No known recent sick contact. No ear pain, vomiting or diarrhea.   PERTINENT  PMH / PSH: Reviewed   OBJECTIVE:   BP 106/64   Pulse (!) 102   Temp 100 F (37.8 C) (Oral)   Wt 223 lb 3.2 oz (101.2 kg)   SpO2 99%   BMI 36.03 kg/m    Physical Exam General: Alert, well appearing, NAD HEENT: Significant oropharyngeal erythema, no tonsillar exudate or cervical lymphadenopathy. Cardiovascular: RRR, No Murmurs, Normal S2/S2 Respiratory: CTAB, No wheezing or Rales Extremities: No edema on extremities     ASSESSMENT/PLAN:  Viral illness 22 y.o. year old presents with fever, body ache and sore throat. He has mildly elevated temp today and on exam has oropharyngeal erythema,  good work of breathing on RA and clear breath sounds bilaterally.  His flu and COVID test were negative.  Overall presentation and exam is consistent with viral URI.   - Discussed conservative management with warm water, honey and lemon. - Recommended tylenol or ibuprofen for fever.  - Encouraged adequate hydration for patient.  - Outline signs and symptoms that will warrant ED visit or return for further assessment.     Alen Bleacher, MD [place]

## 2022-07-19 ENCOUNTER — Ambulatory Visit: Payer: Medicaid Other | Admitting: Family Medicine

## 2022-07-19 VITALS — BP 137/84 | HR 88 | Wt 226.2 lb

## 2022-07-19 DIAGNOSIS — R109 Unspecified abdominal pain: Secondary | ICD-10-CM | POA: Diagnosis not present

## 2022-07-19 MED ORDER — DICLOFENAC SODIUM 1 % EX GEL: 2.0000 g | Freq: Four times a day (QID) | CUTANEOUS | 0 refills | Status: DC | PRN

## 2022-07-19 NOTE — Progress Notes (Signed)
  Date of Visit: 07/19/2022   SUBJECTIVE:   HPI:  Elijah Sosa presents today for a same day appointment to discuss abdominal pain.  For the last 2 days has noted a sharp stabbing pain on the left side of his abdomen.  It was there all day yesterday and noticed it was there again today.  It did not bother him overnight.  He is stooling and urinating normally.  Denies having any blood in his stool, straining, or hard stools.  Denies nausea or vomiting.  Eating and drinking well.  He did have some diarrhea today.  He did note this morning that when he very lightly touched the skin of his upper left abdomen that the pain intensified  PMHx: Seasonal allergies, refugee from [place], remote history of constipation  OBJECTIVE:   BP 137/84   Pulse 88   Wt 226 lb 3.2 oz (102.6 kg)   SpO2 97%   BMI 36.51 kg/m  Gen: No acute distress, pleasant, cooperative, well-appearing HEENT: Normocephalic, atraumatic, moist mucous membranes Heart: Regular rate and rhythm, no murmur Lungs: Clear bilaterally, normal respiratory effort Abdomen: Normoactive bowel sounds.  Soft, nontender to palpation in all quadrants.  No pain elicited at all on exam.  No organomegaly.  No peritoneal signs Neuro: Alert, grossly nonfocal, speech normal  ASSESSMENT/PLAN:   Abdominal pain Overall benign exam today.  Patient is well-appearing and tolerating p.o.  Discussed possible etiologies with him.  Given that the pain was worsened with very light touch of his skin earlier today, favor possible nerve/skin irritation.  I do wonder if this could be an early presentation of shingles.  Advised he should come back in if he develops a rash.  In the meantime, treat with topical NSAID.  Voltaren gel prescribed.  Follow-up if worsening or not improving.  [place]. Ardelia Mems, [place]

## 2022-07-19 NOTE — Patient Instructions (Signed)
It was nice to meet you today.  Come back if you notice a rash or if it is getting worse  Try cream to help with pain  Be well, Dr. Ardelia Mems

## 2022-10-22 ENCOUNTER — Encounter (HOSPITAL_COMMUNITY): Payer: Self-pay | Admitting: Pharmacy Technician

## 2022-10-22 ENCOUNTER — Other Ambulatory Visit: Payer: Self-pay

## 2022-10-22 ENCOUNTER — Emergency Department (HOSPITAL_COMMUNITY)
Admission: EM | Admit: 2022-10-22 | Discharge: 2022-10-22 | Disposition: A | Discharge status: Home / Self Care | Payer: Medicaid Other | Attending: Emergency Medicine | Admitting: Emergency Medicine

## 2022-10-22 DIAGNOSIS — L03213 Periorbital cellulitis: Secondary | ICD-10-CM | POA: Insufficient documentation

## 2022-10-22 DIAGNOSIS — H579 Unspecified disorder of eye and adnexa: Secondary | ICD-10-CM | POA: Diagnosis present

## 2022-10-22 DIAGNOSIS — H00011 Hordeolum externum right upper eyelid: Secondary | ICD-10-CM | POA: Diagnosis not present

## 2022-10-22 MED ORDER — ERYTHROMYCIN 5 MG/GM OP OINT: TOPICAL_OINTMENT | OPHTHALMIC | 0 refills | Status: DC

## 2022-10-22 MED ORDER — AMOXICILLIN-POT CLAVULANATE 875-125 MG PO TABS: 1.0000 | ORAL_TABLET | Freq: Two times a day (BID) | ORAL | 0 refills | Status: DC

## 2022-10-22 MED ORDER — IBUPROFEN 400 MG PO TABS
600.0000 mg | ORAL_TABLET | Freq: Once | ORAL | Status: AC
  Administered 2022-10-22: 600 mg via ORAL
  Filled 2022-10-22: qty 1

## 2022-10-22 NOTE — ED Provider Notes (Signed)
[facility] AT [facility] Provider Note   CSN: [id number] Arrival date & time: 10/22/22  1409     History  Chief Complaint  Patient presents with   Eye Problem    Elijah Sosa is a 22 y.o. male.  Patient presents with right upper eyelid swelling for the past few days.  Painful to touch.  Patient developed headache yesterday.  No neurologic signs or symptoms.  No pain deep within the eyeball itself.  No injuries.  No fevers or chills.  No change or loss of vision.  Minimal drainage.       Home Medications Prior to Admission medications   Medication Sig Start Date End Date Taking? Authorizing Provider  amoxicillin-clavulanate (AUGMENTIN) 875-125 MG tablet Take 1 tablet by mouth every 12 (twelve) hours. 10/22/22  Yes Blane Ohara, MD  erythromycin ophthalmic ointment Place a 1/2 inch ribbon of ointment into the lower eyelid twice daily for 1 week right side 10/22/22  Yes Blane Ohara, MD  diclofenac Sodium (VOLTAREN) 1 % GEL Apply 2 g topically 4 (four) times daily as needed (pain). 07/19/22   Latrelle Dodrill, MD      Allergies    Patient has no known allergies.    Review of Systems   Review of Systems  Constitutional:  Negative for chills and fever.  HENT:  Negative for congestion.   Eyes:  Positive for discharge. Negative for redness and visual disturbance.  Respiratory:  Negative for shortness of breath.   Cardiovascular:  Negative for chest pain.  Gastrointestinal:  Negative for abdominal pain and vomiting.  Genitourinary:  Negative for dysuria and flank pain.  Musculoskeletal:  Negative for back pain, neck pain and neck stiffness.  Skin:  Negative for rash.  Neurological:  Positive for headaches. Negative for light-headedness.    Physical Exam Updated Vital Signs BP 119/72 (BP Location: Right Arm)   Pulse 77   Temp 98.2 F (36.8 C)   Resp 16   SpO2 99%  Physical Exam Vitals and nursing note reviewed.   Constitutional:      General: He is not in acute distress.    Appearance: He is well-developed.  HENT:     Head: Normocephalic.     Comments: Patient has mild swelling tenderness right upper eyelid lateral aspect, no significant warmth/erythema and no induration.  No pain with full extraocular muscle function.  Pupils equal bilateral.  Overall conjunctiva clear.  No foreign body visualized.    Mouth/Throat:     Mouth: Mucous membranes are moist.  Eyes:     General:        Right eye: No discharge.        Left eye: No discharge.     Conjunctiva/sclera: Conjunctivae normal.  Neck:     Trachea: No tracheal deviation.  Cardiovascular:     Rate and Rhythm: Normal rate.  Pulmonary:     Effort: Pulmonary effort is normal.  Abdominal:     General: There is no distension.     Palpations: Abdomen is soft.     Tenderness: There is no abdominal tenderness. There is no guarding.  Musculoskeletal:     Cervical back: Normal range of motion and neck supple. No rigidity.  Skin:    General: Skin is warm.     Capillary Refill: Capillary refill takes less than 2 seconds.     Findings: No rash.  Neurological:     General: No focal deficit present.  Mental Status: He is alert.     Cranial Nerves: No cranial nerve deficit.  Psychiatric:        Mood and Affect: Mood normal.     ED Results / Procedures / Treatments   Labs (all labs ordered are listed, but only abnormal results are displayed) Labs Reviewed - No data to display  EKG None  Radiology No results found.  Procedures Procedures    Medications Ordered in ED Medications  ibuprofen (ADVIL) tablet 600 mg (600 mg Oral Given 10/22/22 1635)    ED Course/ Medical Decision Making/ A&P                             Medical Decision Making Risk Prescription drug management.   Patient presents with right upper eyelid swelling discussed differential including hordeolum, preseptal cellulitis.  No evidence of orbital cellulitis at  this time.  No indication for CT scan or blood work.  Patient well-appearing otherwise.  Discussed follow-up with eye doctor, topical and oral antibiotics and reassessment.  Spanish interpreter used during entire exam and discussion.  Patient comfortable plan.        Final Clinical Impression(s) / ED Diagnoses Final diagnoses:  Preseptal cellulitis of right eye  Hordeolum externum of right upper eyelid    Rx / DC Orders ED Discharge Orders          Ordered    amoxicillin-clavulanate (AUGMENTIN) 875-125 MG tablet  Every 12 hours        10/22/22 1632    erythromycin ophthalmic ointment        10/22/22 1632              Blane Ohara, MD 10/22/22 1639

## 2022-10-22 NOTE — ED Triage Notes (Signed)
Pt here with R eye swelling for the last few days. States painful as well. Yesterday developed a headache.

## 2022-10-22 NOTE — Discharge Instructions (Signed)
Take oral antibiotics and apply topical antibiotics twice daily for 1 week. Follow-up with eye doctor in the next week. Use Tylenol every 4 hours and ibuprofen every 6 hours as needed for pain. Return for new or worsening signs or symptoms.

## 2023-01-17 ENCOUNTER — Encounter (HOSPITAL_COMMUNITY): Payer: Self-pay | Admitting: Pharmacy Technician

## 2023-01-30 ENCOUNTER — Ambulatory Visit (INDEPENDENT_AMBULATORY_CARE_PROVIDER_SITE_OTHER): Payer: Medicaid Other | Admitting: Family Medicine

## 2023-01-30 ENCOUNTER — Encounter: Payer: Self-pay | Admitting: Family Medicine

## 2023-01-30 VITALS — BP 123/77 | HR 76 | Ht 66.0 in | Wt 234.2 lb

## 2023-01-30 DIAGNOSIS — E669 Obesity, unspecified: Secondary | ICD-10-CM

## 2023-01-30 DIAGNOSIS — R109 Unspecified abdominal pain: Secondary | ICD-10-CM

## 2023-01-30 NOTE — Progress Notes (Signed)
    SUBJECTIVE:   CHIEF COMPLAINT / HPI:   Stomach pain -Has pain after eating -Has not identified specific foods -Ongoing for months -Not getting better, not getting worse -Pain is center of stomach -Describes pain as sore -No nausea or vomiting -No radiation -No constipation or diarrhea -Has not tried any medicines -Pain typically start 5 minutes after eating -Occasionally relieved by having bowel movements -No blood in bowel movements  Weight loss -Eating lots of fried foods -Has daily soda -Concerned about his health  PERTINENT  PMH / PSH: Reviewed.  OBJECTIVE:   BP 123/77   Pulse 76   Ht 5\' 6"  (1.676 m)   Wt 234 lb 4 oz (106.3 kg)   SpO2 100%   BMI 37.81 kg/m   General: NAD  Neuro: A&O Cardiovascular: RRR, no murmurs, no peripheral edema Respiratory: normal WOB on RA, CTAB, no wheezes, ronchi or rales Abdomen: soft, TTP in LLQ, non palpable spleen or liver, no rebound or guarding Extremities: Moving all 4 extremities equally   ASSESSMENT/PLAN:   Assessment & Plan Abdominal pain, unspecified abdominal location Patient's presentation and history is fairly nonspecific for clear cause of abdominal pain.  He reassuringly has an overall benign exam today.  I suspect that given his pain is relation to food intake and bowel movements that he may have IBS.  I have low suspicion for inflammatory bowel disorders, autoimmune disorders, or cancer as patient has had weight gain not weight loss.  May be component of GERD to patient's pain however would be uncharacteristic presentation.  Counseled on appropriate dietary modifications and will trial Tums as needed after meals.  Discussed following up in 1 month if pain is not improving.  Consider ordering celiac panel, H. pylori at that time. Obesity (BMI 35.0-39.9 without comorbidity) Several red flags identified in diet including heavy amount of fried foods and daily nondiet soda intake.  Discussed making small changes to  diet for slow steady progression and weight loss.  Discussed reducing or replacing nondiet soda first, to which patient is agreeable.  Open to discussing diet further with nutritionist.  Referred to Dr. Gerilyn Pilgrim.  Return if symptoms worsen or fail to improve.  Celine Mans, MD [facility]

## 2023-01-30 NOTE — Patient Instructions (Addendum)
It was great to see you! Thank you for allowing me to participate in your care!  Our plans for today:  -Please increase amount of fiber in your diet, foods that have lots of fiber include vegetables, fruits, beans.  You may also take fiber supplements such as fiber 1 bars or Gummies. -Please try using the over-the-counter medication times if you start to have pain after you eat. -If these changes are not helping you improve your abdominal pain, please follow-up in 1 month.  -Please continue to work on reducing the amount of soda in your diet.  As this can be a significant source of excess calories.  I have made a referral to our nutritionist.  You may call her at [phone].  Please arrive 15 minutes PRIOR to your next scheduled appointment time! If you do not, this affects OTHER patients' care.  Take care and seek immediate care sooner if you develop any concerns.   Celine Mans, MD, PGY-2 [facility] 4:31 PM 01/30/2023  [facility]

## 2023-01-31 NOTE — Addendum Note (Signed)
Addended by: Caro Laroche on: 01/31/2023 10:08 AM   Modules accepted: Orders

## 2023-02-14 ENCOUNTER — Ambulatory Visit (HOSPITAL_COMMUNITY)
Admission: EM | Admit: 2023-02-14 | Discharge: 2023-02-14 | Disposition: A | Discharge status: Home / Self Care | Payer: Medicaid Other | Attending: Internal Medicine | Admitting: Internal Medicine

## 2023-02-14 DIAGNOSIS — J069 Acute upper respiratory infection, unspecified: Secondary | ICD-10-CM | POA: Diagnosis not present

## 2023-02-14 DIAGNOSIS — Z1152 Encounter for screening for COVID-19: Secondary | ICD-10-CM | POA: Insufficient documentation

## 2023-02-14 DIAGNOSIS — R059 Cough, unspecified: Secondary | ICD-10-CM | POA: Insufficient documentation

## 2023-02-14 LAB — POCT RAPID STREP A (OFFICE): Rapid Strep A Screen: NEGATIVE

## 2023-02-14 LAB — SARS CORONAVIRUS 2 (TAT 6-24 HRS): SARS Coronavirus 2: NEGATIVE

## 2023-02-14 MED ORDER — IBUPROFEN 600 MG PO TABS: 600.0000 mg | ORAL_TABLET | Freq: Four times a day (QID) | ORAL | 0 refills | Status: DC | PRN

## 2023-02-14 MED ORDER — GUAIFENESIN ER 1200 MG PO TB12: 1200.0000 mg | ORAL_TABLET | Freq: Two times a day (BID) | ORAL | 0 refills | Status: DC

## 2023-02-14 NOTE — ED Triage Notes (Signed)
Pt is here for sore throat, headache and lower back pain x 2 days. Pt is not taken any medication to help.

## 2023-02-14 NOTE — Discharge Instructions (Signed)
You have a viral illness which will improve on its own with rest, fluids, and medications to help with your symptoms. COVID testing is pending, staff will call you if this is positive. Wear a mask for 5 days of symptoms while you are in public, then you may remove your mask. You may go back to work if you do not have a fever for 24 hours without any medicines.  We discussed prescriptions that may help with your symptoms: guaifenesin You may use over the counter medicines as needed: tylenol/motrin, mucinex, zyrtec, Flonase Two teaspoons of honey in 1 cup of warm water every 4-6 hours may help with throat pains. Humidifier in room at nighttime may help soothe cough (clean well daily).   For chest pain, shortness of breath, inability to keep food or fluids down without vomiting, fever that does not respond to tylenol or motrin, or any other severe symptoms, please go to the ER for further evaluation. Return to urgent care as needed, otherwise follow-up with PCP.

## 2023-02-14 NOTE — ED Provider Notes (Signed)
[facility]    CSN: [id number] Arrival date & time: 02/14/23  1144      History   Chief Complaint Chief Complaint  Patient presents with   Sore Throat   Headache   Back Pain    HPI Elijah Sosa is a 22 y.o. male.   Patient presents to urgent care for evaluation of sore throat, nasal congestion, and generalized headache that started 2 days ago.  Sore throat is worsened by swallowing.  Headache is generalized and currently mild.  No dizziness, nausea, vomiting, cough, fevers, chills, shortness of breath, chest pain, heart palpitations, rash.  Sore throat has improved significantly since onset 2 days ago.  No recent known sick contacts with similar symptoms.  History of asthma as a child but has not suffered from this as an adult.  Non-smoker, denies drug use.  He has not used any over-the-counter medications to help with symptoms PTA.    Sore Throat Associated symptoms include headaches.  Headache Associated symptoms: back pain   Back Pain Associated symptoms: headaches     Past Medical History:  Diagnosis Date   Asthma    Childhood asthma.  Has not used/needed inhalers for 4-5 years.  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 Authorizing Provider   Guaifenesin 1200 MG TB12 Take 1 tablet (1,200 mg total) by mouth in the morning and at bedtime. 02/14/23  Yes Carlisle Beers, FNP  ibuprofen (ADVIL) 600 MG tablet Take 1 tablet (600 mg total) by mouth every 6 (six) hours as needed. 02/14/23  Yes Carlisle Beers, FNP  diclofenac Sodium (VOLTAREN) 1 % GEL Apply 2 g topically 4 (four) times daily as needed (pain). 07/19/22   Latrelle Dodrill, MD  erythromycin ophthalmic ointment Place a 1/2 inch ribbon of ointment into the lower eyelid twice daily for 1 week right side 10/22/22   Blane Ohara, MD    Family History No family history on file.  Social History Social History   Tobacco Use   Smoking status: Never   Smokeless tobacco: Never  Substance Use Topics   Alcohol use: Never   Drug use: Never     Allergies   Patient has no known allergies.   Review of Systems Review of Systems  Musculoskeletal:  Positive for back pain.  Neurological:  Positive for headaches.  Per HPI   Physical Exam Triage Vital Signs ED Triage Vitals [02/14/23 1201]  Encounter Vitals Group     BP 119/80     Systolic BP Percentile      Diastolic BP Percentile      Pulse Rate 86     Resp 16     Temp 98.7 F (37.1 C)     Temp Source Oral  SpO2 96 %     Weight      Height      Head Circumference      Peak Flow      Pain Score      Pain Loc      Pain Education      Exclude from Growth Chart    No data found.  Updated Vital Signs BP 119/80 (BP Location: Left Arm)   Pulse 86   Temp 98.7 F (37.1 C) (Oral)   Resp 16   SpO2 96%   Visual Acuity Right Eye Distance:   Left Eye Distance:   Bilateral Distance:    Right Eye Near:   Left Eye Near:    Bilateral Near:     Physical Exam Vitals and nursing note reviewed.  Constitutional:      Appearance: He is not ill-appearing or toxic-appearing.  HENT:     Head: Normocephalic and atraumatic.     Right Ear: Hearing, tympanic membrane, ear canal and external ear normal.      Left Ear: Hearing, tympanic membrane, ear canal and external ear normal.     Nose: Congestion present.     Mouth/Throat:     Lips: Pink.     Mouth: Mucous membranes are moist. No injury.     Tongue: No lesions. Tongue does not deviate from midline.     Palate: No mass and lesions.     Pharynx: Oropharynx is clear. Uvula midline. Posterior oropharyngeal erythema present. No pharyngeal swelling, oropharyngeal exudate or uvula swelling.     Tonsils: No tonsillar exudate or tonsillar abscesses.     Comments: Mild erythema to the posterior oropharynx without evidence of tonsillar hypertrophy.  Maintaining secretions well without drooling or speech changes. Eyes:     General: Lids are normal. Vision grossly intact. Gaze aligned appropriately.     Extraocular Movements: Extraocular movements intact.     Conjunctiva/sclera: Conjunctivae normal.  Cardiovascular:     Rate and Rhythm: Normal rate and regular rhythm.     Heart sounds: Normal heart sounds, S1 normal and S2 normal.  Pulmonary:     Effort: Pulmonary effort is normal. No respiratory distress.     Breath sounds: Normal breath sounds and air entry.  Musculoskeletal:     Cervical back: Neck supple.  Skin:    General: Skin is warm and dry.     Capillary Refill: Capillary refill takes less than 2 seconds.     Findings: No rash.  Neurological:     General: No focal deficit present.     Mental Status: He is alert and oriented to person, place, and time. Mental status is at baseline.     Cranial Nerves: No dysarthria or facial asymmetry.  Psychiatric:        Mood and Affect: Mood normal.        Speech: Speech normal.        Behavior: Behavior normal.        Thought Content: Thought content normal.        Judgment: Judgment normal.      UC Treatments / Results  Labs (all labs ordered are listed, but only abnormal results are displayed) Labs Reviewed  SARS CORONAVIRUS 2 (TAT 6-24 HRS)  CULTURE, GROUP A STREP [facility])  POCT RAPID  STREP A (OFFICE)    EKG   Radiology No results found.  Procedures Procedures (including critical care time)  Medications Ordered in UC Medications - No data to display  Initial  Impression / Assessment and Plan / UC Course  I have reviewed the triage vital signs and the nursing notes.  Pertinent labs & imaging results that were available during my care of the patient were reviewed by me and considered in my medical decision making (see chart for details).   1.  Viral URI with cough Suspect viral URI, viral syndrome. Physical exam findings reassuring, vital signs hemodynamically stable. Low suspicion for pneumonia/acute cardiopulmonary abnormality, therefore deferred imaging of the chest. Advised supportive care, offered prescriptions for symptomatic relief.  Recommend continued use of OTC medications as needed, recommendations discussed with patient/caregiver and outlined in AVS.  Strep/viral testing: strep negative, culture pending, COVID pending, he is a candidate for antiviral given history of asthma and obesity. May have paxlovid if positive.  Counseled patient on potential for adverse effects with medications prescribed/recommended today, strict ER and return-to-clinic precautions discussed, patient verbalized understanding.    Final Clinical Impressions(s) / UC Diagnoses   Final diagnoses:  Viral URI with cough     Discharge Instructions      You have a viral illness which will improve on its own with rest, fluids, and medications to help with your symptoms. COVID testing is pending, staff will call you if this is positive. Wear a mask for 5 days of symptoms while you are in public, then you may remove your mask. You may go back to work if you do not have a fever for 24 hours without any medicines.  We discussed prescriptions that may help with your symptoms: guaifenesin You may use over the counter medicines as needed: tylenol/motrin, mucinex, zyrtec, Flonase Two  teaspoons of honey in 1 cup of warm water every 4-6 hours may help with throat pains. Humidifier in room at nighttime may help soothe cough (clean well daily).   For chest pain, shortness of breath, inability to keep food or fluids down without vomiting, fever that does not respond to tylenol or motrin, or any other severe symptoms, please go to the ER for further evaluation. Return to urgent care as needed, otherwise follow-up with PCP.      ED Prescriptions     Medication Sig Dispense Auth. Provider   ibuprofen (ADVIL) 600 MG tablet Take 1 tablet (600 mg total) by mouth every 6 (six) hours as needed. 30 tablet Reita May M, FNP   Guaifenesin 1200 MG TB12 Take 1 tablet (1,200 mg total) by mouth in the morning and at bedtime. 14 tablet Carlisle Beers, FNP      PDMP not reviewed this encounter.   Carlisle Beers, [place] 02/14/23 1316

## 2023-02-16 LAB — CULTURE, GROUP A STREP (THRC)

## 2023-02-22 ENCOUNTER — Ambulatory Visit: Payer: Medicaid Other | Admitting: Family Medicine

## 2023-03-01 ENCOUNTER — Other Ambulatory Visit: Payer: Self-pay

## 2023-03-01 ENCOUNTER — Ambulatory Visit: Payer: Medicaid Other | Admitting: Student

## 2023-03-01 VITALS — BP 114/59 | HR 101 | Temp 98.8°F | Ht 67.0 in | Wt 230.4 lb

## 2023-03-01 DIAGNOSIS — J069 Acute upper respiratory infection, unspecified: Secondary | ICD-10-CM | POA: Diagnosis not present

## 2023-03-01 MED ORDER — DEXTROMETHORPHAN-GUAIFENESIN 10-100 MG/5ML PO SYRP: 5.0000 mL | ORAL_SOLUTION | Freq: Two times a day (BID) | ORAL | 0 refills | Status: DC

## 2023-03-01 NOTE — Progress Notes (Signed)
  SUBJECTIVE:   CHIEF COMPLAINT / HPI:   Cold symptoms HA, sore throat, and cough, with body aches and pain behind the eyes. Everything started yesterday. No fever's, nausea, or diarrhea. Note's his sister at home is also sick. Cough is a little productive. About 3 weeks ago he had similar symptoms, but got completely well after.   PERTINENT  PMH / PSH:   Past Medical History:  Diagnosis Date   Asthma    Childhood asthma.  Has not used/needed inhalers for 4-5 years.  Did have multiple hospitalizations as young child in [place]   OBJECTIVE:  BP (!) 114/59   Pulse (!) 101   Temp 98.8 F (37.1 C) (Oral)   Ht 5\' 7"  (1.702 m)   Wt 230 lb 6.4 oz (104.5 kg)   SpO2 98%   BMI 36.09 kg/m  Physical Exam Constitutional:      General: He is not in acute distress.    Appearance: Normal appearance. He is ill-appearing.  Cardiovascular:     Rate and Rhythm: Normal rate and regular rhythm.     Pulses: Normal pulses.     Heart sounds: Normal heart sounds. No murmur heard.    No friction rub. No gallop.  Pulmonary:     Effort: Pulmonary effort is normal. No respiratory distress.     Breath sounds: Normal breath sounds. No stridor. No wheezing, rhonchi or rales.  Abdominal:     General: There is no distension.     Palpations: Abdomen is soft.     Tenderness: There is no abdominal tenderness.  Lymphadenopathy:     Cervical: No cervical adenopathy.  Skin:    Capillary Refill: Capillary refill takes less than 2 seconds.  Neurological:     Mental Status: He is alert.  Psychiatric:        Mood and Affect: Mood normal.        Behavior: Behavior normal.      ASSESSMENT/PLAN:  Upper respiratory infection, viral Assessment & Plan: Patient comes in for 1 day of URI symptoms, with headache, sore throat, cough, body aches.  Patient denies any fevers.  Lowers concern for pneumonia given lack of fever, given headache and sore throat, and bodyaches, patient most likely suffering from upper  respiratory virus.  Will recommend supportive care.  Cough syrup provided. - Cough syrup   Other orders -     Dextromethorphan-guaiFENesin; Take 5 mLs by mouth every 12 (twelve) hours.  Dispense: 236 mL; Refill: 0   No follow-ups on file. Bess Kinds, MD 03/03/2023, 5:01 PM PGY-3, [facility]

## 2023-03-01 NOTE — Patient Instructions (Signed)
It was great to see you! Thank you for allowing me to participate in your care!  Our plans for today:  -Cold Symptoms  It appears that you have a virus that's causing your symptoms. This should get better on it's own in time. Be sure to stay hydrated and eat when you can.   -Take Tylenol or ibuprofen as needed for body aches and throat pain    Take care and seek immediate care sooner if you develop any concerns.   Dr. Bess Kinds, MD [facility]

## 2023-03-03 ENCOUNTER — Ambulatory Visit (HOSPITAL_COMMUNITY)
Admission: EM | Admit: 2023-03-03 | Discharge: 2023-03-03 | Disposition: A | Discharge status: Home / Self Care | Payer: Medicaid Other | Attending: Internal Medicine | Admitting: Internal Medicine

## 2023-03-03 ENCOUNTER — Ambulatory Visit (INDEPENDENT_AMBULATORY_CARE_PROVIDER_SITE_OTHER): Payer: Medicaid Other

## 2023-03-03 DIAGNOSIS — R051 Acute cough: Secondary | ICD-10-CM

## 2023-03-03 DIAGNOSIS — J069 Acute upper respiratory infection, unspecified: Secondary | ICD-10-CM

## 2023-03-03 DIAGNOSIS — J189 Pneumonia, unspecified organism: Secondary | ICD-10-CM | POA: Diagnosis not present

## 2023-03-03 DIAGNOSIS — R0602 Shortness of breath: Secondary | ICD-10-CM | POA: Diagnosis not present

## 2023-03-03 DIAGNOSIS — R918 Other nonspecific abnormal finding of lung field: Secondary | ICD-10-CM | POA: Diagnosis not present

## 2023-03-03 DIAGNOSIS — R059 Cough, unspecified: Secondary | ICD-10-CM | POA: Diagnosis not present

## 2023-03-03 HISTORY — DX: Acute upper respiratory infection, unspecified: J06.9

## 2023-03-03 MED ORDER — AMOXICILLIN-POT CLAVULANATE 875-125 MG PO TABS: 1.0000 | ORAL_TABLET | Freq: Two times a day (BID) | ORAL | 0 refills | Status: AC

## 2023-03-03 MED ORDER — PROMETHAZINE-DM 6.25-15 MG/5ML PO SYRP: 5.0000 mL | ORAL_SOLUTION | Freq: Four times a day (QID) | ORAL | 0 refills | Status: DC | PRN

## 2023-03-03 MED ORDER — ALBUTEROL SULFATE HFA 108 (90 BASE) MCG/ACT IN AERS
INHALATION_SPRAY | RESPIRATORY_TRACT | Status: AC
  Filled 2023-03-03: qty 6.7

## 2023-03-03 MED ORDER — ALBUTEROL SULFATE HFA 108 (90 BASE) MCG/ACT IN AERS: 1.0000 | INHALATION_SPRAY | Freq: Four times a day (QID) | RESPIRATORY_TRACT | 0 refills | Status: DC | PRN

## 2023-03-03 MED ORDER — ALBUTEROL SULFATE HFA 108 (90 BASE) MCG/ACT IN AERS
2.0000 | INHALATION_SPRAY | Freq: Once | RESPIRATORY_TRACT | Status: AC
  Administered 2023-03-03: 2 via RESPIRATORY_TRACT

## 2023-03-03 NOTE — Assessment & Plan Note (Signed)
Patient comes in for 1 day of URI symptoms, with headache, sore throat, cough, body aches.  Patient denies any fevers.  Lowers concern for pneumonia given lack of fever, given headache and sore throat, and bodyaches, patient most likely suffering from upper respiratory virus.  Will recommend supportive care.  Cough syrup provided. - Cough syrup

## 2023-03-03 NOTE — ED Provider Notes (Signed)
[facility]    CSN: [id number] Arrival date & time: 03/03/23  1730      History   Chief Complaint Chief Complaint  Patient presents with   Headache   Shortness of Breath   Cough    HPI Elijah Sosa is a 22 y.o. male.   22 year old male who presents to urgent care with complaints of shortness of breath, cough and headache that has been going on for several days. This started on Thursday. Some mild pain with swallowing.  Temperature 99.4 here. Felt hot yesterday. He was seen here on October 2 for similar symptoms and treated with conservative management and those symptoms went away but now back.  At that time he was negative for strep and COVID. Sister is having similar symptoms.    Headache Associated symptoms: back pain, cough and sore throat   Associated symptoms: no abdominal pain, no congestion, no diarrhea, no ear pain, no eye pain, no fever, no nausea, no seizures and no vomiting   Shortness of Breath Associated symptoms: cough, headaches and sore throat   Associated symptoms: no abdominal pain, no chest pain, no ear pain, no fever, no rash and no vomiting   Cough Associated symptoms: headaches, shortness of breath and sore throat   Associated symptoms: no chest pain, no chills, no ear pain, no fever and no rash     Past Medical History:  Diagnosis Date   Asthma    Childhood asthma.  Has not used/needed inhalers for 4-5 years.  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 Authorizing Provider  Dextromethorphan-guaiFENesin 10-100 MG/5ML liquid Take 5 mLs by mouth every 12 (twelve) hours. 03/01/23   Bess Kinds, MD  diclofenac Sodium (VOLTAREN) 1 % GEL Apply 2 g topically 4 (four) times daily as needed (pain). 07/19/22   Latrelle Dodrill, MD  erythromycin ophthalmic ointment Place a 1/2 inch ribbon of ointment into the lower eyelid twice daily for 1 week right side 10/22/22   Blane Ohara, MD  ibuprofen (ADVIL) 600 MG tablet Take 1 tablet (600 mg total) by mouth every 6 (six) hours as needed. 02/14/23   Carlisle Beers, FNP    Family History No family history on file.  Social History Social History   Tobacco Use   Smoking status: Never   Smokeless tobacco: Never  Substance Use Topics   Alcohol use: Never   Drug use: Never     Allergies   Patient has no known allergies.   Review of Systems Review of Systems  Constitutional:  Negative for chills and fever.  HENT:  Positive for sore throat. Negative for congestion and ear pain.   Eyes:  Negative for pain and visual disturbance.  Respiratory:  Positive for cough and shortness of breath.   Cardiovascular:  Negative for chest pain and palpitations.  Gastrointestinal:  Negative for abdominal pain, diarrhea, nausea and vomiting.  Genitourinary:  Negative for dysuria and hematuria.  Musculoskeletal:  Positive for back pain. Negative for arthralgias.  Skin:  Negative for color change and rash.  Neurological:  Positive for headaches. Negative for seizures and syncope.  All other systems reviewed and are negative.    Physical Exam Triage Vital Signs ED Triage Vitals  Encounter Vitals Group     BP      Systolic  BP Percentile      Diastolic BP Percentile      Pulse      Resp      Temp      Temp src      SpO2      Weight      Height      Head Circumference      Peak Flow      Pain Score      Pain Loc      Pain Education      Exclude from Growth Chart    No data found.  Updated Vital Signs There were no vitals taken for this visit.  Visual Acuity Right Eye Distance:   Left Eye Distance:   Bilateral Distance:    Right Eye Near:   Left Eye Near:    Bilateral Near:     Physical Exam Vitals and nursing note reviewed.  Constitutional:      General: He is not in acute distress.    Appearance: He is well-developed.  HENT:     Head: Normocephalic and atraumatic.  Eyes:     Conjunctiva/sclera: Conjunctivae normal.  Cardiovascular:     Rate and Rhythm: Normal rate and regular rhythm.     Heart sounds: No murmur heard. Pulmonary:     Effort: Pulmonary effort is normal. No respiratory distress.     Breath sounds: Examination of the left-upper field reveals decreased breath sounds, wheezing and rhonchi. Examination of the right-lower field reveals decreased breath sounds. Examination of the left-lower field reveals decreased breath sounds and rhonchi. Decreased breath sounds, wheezing and rhonchi present.  Abdominal:     Palpations: Abdomen is soft.     Tenderness: There is no abdominal tenderness.  Musculoskeletal:        General: No swelling.     Cervical back: Neck supple.  Skin:    General: Skin is warm and dry.     Capillary Refill: Capillary refill takes less than 2 seconds.  Neurological:     Mental Status: He is alert.  Psychiatric:        Mood and Affect: Mood normal.      UC Treatments / Results  Labs (all labs ordered are listed, but only abnormal results are displayed) Labs Reviewed - No data to display  EKG   Radiology No results found.  Procedures Procedures (including critical care time)  Medications Ordered in UC Medications - No data to  display  Initial Impression / Assessment and Plan / UC Course  I have reviewed the triage vital signs and the nursing notes.  Pertinent labs & imaging results that were available during my care of the patient were reviewed by me and considered in my medical decision making (see chart for details).     Acute cough  Pneumonia of left upper lobe due to infectious organism   X-ray done today and final results  will be available later tonight or tomorrow after the radiologist reads the x-ray however on review it appears to show a mild pneumonia.  Will plan to treat this outpatient as your vital signs are stable and it is reasonable to treat this as an outpatient.  We will treat with the following.  Augmentin 1 tablet twice daily for 10 days. Take with food. Promethazine DM 5 mLs every 6 hours as needed for cough, use caution as this can cause drowsiness.  Albuterol inhaler 1-2 puffs every 6 hours as needed for wheezing.  Rest and stay hydrated. Return to urgent care or PCP if symptoms worsen or fail to resolve.    Final Clinical Impressions(s) / UC Diagnoses   Final diagnoses:  None   Discharge Instructions   None    ED Prescriptions   None    PDMP not reviewed this encounter.   Landis Martins, [place] 03/03/23 1911

## 2023-03-03 NOTE — ED Triage Notes (Addendum)
Pt c/o headache, cough, sob that started Thursday. Pt states his he has been checking his O2 and it has been around 88-92%. Today it is 92%  Pt took ibuprofen around 10 am

## 2023-03-03 NOTE — Discharge Instructions (Addendum)
X-ray done today and final results will be available later tonight or tomorrow after the radiologist reads the x-ray however on review it appears to show a mild pneumonia.  Will plan to treat this outpatient as your vital signs are stable and it is reasonable to treat this as an outpatient.  We will treat with the following.  Augmentin 1 tablet twice daily for 10 days. Take with food. Promethazine DM 5 mLs every 6 hours as needed for cough, use caution as this can cause drowsiness.  Albuterol inhaler 1-2 puffs every 6 hours as needed for wheezing.  Rest and stay hydrated. Return to urgent care or PCP if symptoms worsen or fail to resolve.

## 2023-03-26 ENCOUNTER — Ambulatory Visit (INDEPENDENT_AMBULATORY_CARE_PROVIDER_SITE_OTHER): Payer: Medicaid Other | Admitting: Student

## 2023-03-26 VITALS — BP 124/79 | HR 92 | Ht 66.0 in | Wt 229.2 lb

## 2023-03-26 DIAGNOSIS — R1030 Lower abdominal pain, unspecified: Secondary | ICD-10-CM

## 2023-03-26 NOTE — Patient Instructions (Addendum)
Ival Bible to meet you! I think the soreness in your belly is from lifting at work. This should get better with time. The best way to prevent this in the future is to make sure those muscles are strong. Weightlifting is your friend!  Eliezer Mccoy, MD

## 2023-03-26 NOTE — Progress Notes (Unsigned)
    SUBJECTIVE:   CHIEF COMPLAINT / HPI:   Abdominal Pain Patient coming in with pain of the abdominal rectus muscles present since Saturday 11/9.  He works a fairly physical job lifting heavy boxes.  Does not do much physical exercise otherwise.  His pain is bilateral in nature.  He denies any fever, chills, changes in stooling or voiding patterns.  Feels well otherwise.  Has no sick contacts.  Denies any trauma to the abdomen.  No history of abdominal surgeries. He was seen by Dr. Velna Ochs for more generalized to epigastric abdominal pain on 9/17 but reports that the pain that he is having today is different. There is no association with eating or drinking. His last bowel movement was about an hour ago and was normal.    OBJECTIVE:   BP 124/79   Pulse 92   Ht 5\' 6"  (1.676 m)   Wt 229 lb 3.2 oz (104 kg)   SpO2 96%   BMI 36.99 kg/m   Gen: Well-appearing, NAD, in good spirits HENT: MMM, neck supple and without LAD  Cardio: RRR, no murmur Pulm: Normal WOB on RA, lungs clear throughout Abd: Non-tender and non-distended, there is no organomegaly  Ext; Without edema or deformity   ASSESSMENT/PLAN:   Lower abdominal pain Pain seems localized to the abdominis rectus muscles, I suspect this is a delayed onset muscle soreness from lifting heavy boxes at work.  He has a benign abdominal exam today, very much doubt intra-abdominal process.  He is nontender to my exam.  I am reassured that the exacerbating feature of his pain is a sneeze which involves strong contraction of these muscles consistent with a typical muscle soreness. -Would advise regular resistance exercise to strengthen these muscles     J Dorothyann Gibbs, MD [facility]

## 2023-03-27 NOTE — Assessment & Plan Note (Signed)
Pain seems localized to the abdominis rectus muscles, I suspect this is a delayed onset muscle soreness from lifting heavy boxes at work.  He has a benign abdominal exam today, very much doubt intra-abdominal process.  He is nontender to my exam.  I am reassured that the exacerbating feature of his pain is a sneeze which involves strong contraction of these muscles consistent with a typical muscle soreness. -Would advise regular resistance exercise to strengthen these muscles

## 2023-04-27 LAB — AMB RESULTS CONSOLE CBG: Glucose: 102

## 2023-05-17 ENCOUNTER — Ambulatory Visit: Payer: Medicaid Other | Admitting: Student

## 2023-05-17 VITALS — BP 118/62 | HR 95 | Wt 232.6 lb

## 2023-05-17 DIAGNOSIS — J069 Acute upper respiratory infection, unspecified: Secondary | ICD-10-CM | POA: Diagnosis not present

## 2023-05-17 MED ORDER — ALBUTEROL SULFATE HFA 108 (90 BASE) MCG/ACT IN AERS: 1.0000 | INHALATION_SPRAY | Freq: Four times a day (QID) | RESPIRATORY_TRACT | 0 refills | Status: DC | PRN

## 2023-05-17 NOTE — Patient Instructions (Signed)
 It was great to see you! Thank you for allowing me to participate in your care!   Our plans for today:  - This is most likely a viral infection -Your lungs sound good on examination -I am sending in Flonase  to take daily to help with congestion -Please continue Tylenol 500 mg every 6 hours as needed and ibuprofen  every 6 hours as needed - I am refilling albuterol   Take care and seek immediate care sooner if you develop any concerns.  Wendel Lesch, MD

## 2023-05-17 NOTE — Progress Notes (Signed)
    SUBJECTIVE:   CHIEF COMPLAINT / HPI: Sick  Sick for 3 days Congested and coughing Headaches Family members sick Short of breath sometimes but not now Asthma as a kid, states he had albuterol  but not taking it Hx of PNA in October No fevers  PERTINENT  PMH / PSH: asthma as a child  OBJECTIVE:   BP 118/62   Pulse 95   Wt 232 lb 9.6 oz (105.5 kg)   SpO2 97%   BMI 37.54 kg/m   General: Well appearing, NAD, awake, alert, responsive to questions Head: Normocephalic atraumatic, significant nasal congestion b/l, no cervical adenopathy CV: Regular rate and rhythm  Respiratory: Clear to ausculation bilaterally, no wheezes rales or crackles, chest rises symmetrically,  no increased work of breathing on RA Abdomen: Soft, non-tender, non-distended Extremities: Moves upper and lower extremities freely, no edema in LE  ASSESSMENT/PLAN:   Assessment & Plan Viral URI with cough Symptoms of congestion, cough, and headache for 3 days. Benign exam, consistent with Viral URI. No sign of asthma exacerbation.  -Start Flonase  daily for congestion. -Tylenol and Ibuprofen  PRN -Refilled albuterol  -Symptomatic measures discussed -ED/Return precautions discussed   Wendel Lesch, MD [facility]

## 2023-08-23 ENCOUNTER — Encounter: Payer: Self-pay | Admitting: Family Medicine

## 2023-08-23 ENCOUNTER — Ambulatory Visit: Admitting: Family Medicine

## 2023-08-23 VITALS — BP 137/75 | HR 108 | Temp 98.8°F | Ht 66.0 in | Wt 234.4 lb

## 2023-08-23 DIAGNOSIS — J302 Other seasonal allergic rhinitis: Secondary | ICD-10-CM

## 2023-08-23 DIAGNOSIS — J069 Acute upper respiratory infection, unspecified: Secondary | ICD-10-CM

## 2023-08-23 MED ORDER — FLUTICASONE PROPIONATE 50 MCG/ACT NA SUSP
2.0000 | Freq: Every day | NASAL | 6 refills | Status: DC
Start: 2023-08-23 — End: 2023-09-21

## 2023-08-23 MED ORDER — CETIRIZINE HCL 10 MG PO TABS
10.0000 mg | ORAL_TABLET | Freq: Every day | ORAL | 11 refills | Status: DC
Start: 2023-08-23 — End: 2023-09-21

## 2023-08-23 NOTE — Assessment & Plan Note (Signed)
 Suspect underlying allergies given physical exam findings, current seasonal changes with significant pollen. - Fluticasone spray daily - Cetirizine 10 mg daily

## 2023-08-23 NOTE — Patient Instructions (Addendum)
 General Recommendations:   Please drink plenty of fluids Get plenty of rest  Sleep in humidified air Use saline nasal sprays Stand over boiling pot and inhale steam Take hot shower with steam  OTC Medications:  Decongestants - helps relieve congestion  Flonase (generic fluticasone) or Nasacort (generic triamcinolone) - please make sure to use the "cross-over" technique at a 45 degree angle towards the opposite eye as opposed to straight up the nasal passageway.   Allergies - helps relieve runny nose, itchy eyes and sneezing  Claritin (generic loratidine), Allegra (fexofenidine), or Zyrtec (generic cyrterizine) for runny nose. These medications should not cause drowsiness. Note - Benadryl (generic diphenhydramine) may be used however may cause drowsiness  Cough -  Delsym or Robitussin (generic dextromethorphan)  Expectorants - helps loosen mucus to ease removal  Mucinex (generic guaifenesin) as directed on the package.  Headaches / General Aches  Tylenol (generic acetaminophen) - DO NOT EXCEED 3 grams (3,000 mg) in a 24 hour time period Advil/Motrin (generic ibuprofen)  Sore Throat -  Salt water gargle  Honey can help with sore throat Chloraseptic (generic benzocaine) spray or lozenges / Sucrets (generic dyclonine)    Sinusitis Sinusitis is redness, soreness, and inflammation of the paranasal sinuses. Paranasal sinuses are air pockets within the bones of your face (beneath the eyes, the middle of the forehead, or above the eyes). In healthy paranasal sinuses, mucus is able to drain out, and air is able to circulate through them by way of your nose. However, when your paranasal sinuses are inflamed, mucus and air can become trapped. This can allow bacteria and other germs to grow and cause infection. Sinusitis can develop quickly and last only a short time (acute) or continue over a long period (chronic). Sinusitis that lasts for more than 12 weeks is considered chronic.   CAUSES  Causes of sinusitis include: Allergies. Structural abnormalities, such as displacement of the cartilage that separates your nostrils (deviated septum), which can decrease the air flow through your nose and sinuses and affect sinus drainage. Functional abnormalities, such as when the small hairs (cilia) that line your sinuses and help remove mucus do not work properly or are not present. SIGNS AND SYMPTOMS  Symptoms of acute and chronic sinusitis are the same. The primary symptoms are pain and pressure around the affected sinuses. Other symptoms include: Upper toothache. Earache. Headache. Bad breath. Decreased sense of smell and taste. A cough, which worsens when you are lying flat. Fatigue. Fever. Thick drainage from your nose, which often is green and may contain pus (purulent). Swelling and warmth over the affected sinuses. DIAGNOSIS  Your health care provider will perform a physical exam. During the exam, your health care provider may: Look in your nose for signs of abnormal growths in your nostrils (nasal polyps). Tap over the affected sinus to check for signs of infection. View the inside of your sinuses (endoscopy) using an imaging device that has a light attached (endoscope). If your health care provider suspects that you have chronic sinusitis, one or more of the following tests may be recommended: Allergy tests. Nasal culture. A sample of mucus is taken from your nose, sent to a lab, and screened for bacteria. Nasal cytology. A sample of mucus is taken from your nose and examined by your health care provider to determine if your sinusitis is related to an allergy. TREATMENT  Most cases of acute sinusitis are related to a viral infection and will resolve on their own within 10 days. Sometimes  medicines are prescribed to help relieve symptoms (pain medicine, decongestants, nasal steroid sprays, or saline sprays).  However, for sinusitis related to a bacterial infection,  your health care provider will prescribe antibiotic medicines. These are medicines that will help kill the bacteria causing the infection.  Rarely, sinusitis is caused by a fungal infection. In theses cases, your health care provider will prescribe antifungal medicine. For some cases of chronic sinusitis, surgery is needed. Generally, these are cases in which sinusitis recurs more than 3 times per year, despite other treatments. HOME CARE INSTRUCTIONS  Drink plenty of water. Water helps thin the mucus so your sinuses can drain more easily. Use a humidifier. Inhale steam 3 to 4 times a day (for example, sit in the bathroom with the shower running). Apply a warm, moist washcloth to your face 3 to 4 times a day, or as directed by your health care provider. Use saline nasal sprays to help moisten and clean your sinuses. Take medicines only as directed by your health care provider. If you were prescribed either an antibiotic or antifungal medicine, finish it all even if you start to feel better. SEEK IMMEDIATE MEDICAL CARE IF: You have increasing pain or severe headaches. You have nausea, vomiting, or drowsiness. You have swelling around your face. You have vision problems. You have a stiff neck. You have difficulty breathing. MAKE SURE YOU:  Understand these instructions. Will watch your condition. Will get help right away if you are not doing well or get worse. Document Released: 05/01/2005 Document Revised: 09/15/2013 Document Reviewed: 05/16/2011 [facility] Patient Information 2015 [place], [place]. This information is not intended to replace advice given to you by your health care provider. Make sure you discuss any questions you have with your health care provider.

## 2023-08-23 NOTE — Progress Notes (Signed)
   SUBJECTIVE:   CHIEF COMPLAINT / HPI:  Elijah Sosa is a 23 y.o. male with a pertinent past medical history of seasonal allergies presenting to the clinic for upper respiratory symptoms.  Viral illness Throat started hurting 3 days ago. Used some Vick's Vaporub on neck, felt a bit better. Started coughing more and more the day after with light green phlegm. Runny nose started yesterday. Mild headache started yesterday as well. No fevers, no nausea, no vomiting. Moderate arthralgias, myalgias. 2 bowel movements yesterday. Able to drink water. Also using acetaminophen daily for pains. Has albuterol medicine list, but denies having asthma history. No dyspnea, including with exertion at work.   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   OBJECTIVE:   BP 137/75   Pulse (!) 108   Temp 98.8 F (37.1 C) (Oral)   Ht 5\' 6"  (1.676 m)   Wt 234 lb 6.4 oz (106.3 kg)   SpO2 97%   BMI 37.83 kg/m   General: Age-appropriate, resting comfortably in chair, NAD, alert and at baseline. HEENT:  Head: Normocephalic, atraumatic. No tenderness to percussion over sinuses. Eyes: PERRLA. Notable conjunctival erythema, no scleral injections. Nose: Boggy erythematous turbinates. Copious rhinorrhea. Mouth/Oral: Clear, no tonsillar exudate. MMM. Neck: Supple. No LAD. Cardiovascular: Tachycardic rate and regular rhythm. Normal S1/S2. No murmurs, rubs, or gallops appreciated. 2+ radial pulses. Pulmonary: Clear bilaterally to ascultation. No wheezes, crackles, or rhonchi. Normal WOB on room air. Abdominal: No tenderness to deep or light palpation. Extremities: No peripheral edema bilaterally. Capillary refill <2 seconds.   ASSESSMENT/PLAN:   Assessment & Plan Viral URI Favor viral URI, possible viral sinusitis.  No evidence of bacterial infection including pneumonia or bacterial sinusitis.  Do note tachycardia, however patient appears well  overall.  Reassuringly, no fevers and well-hydrated on exam.   - Supportive care discussed per AVS - Continue acetaminophen as needed for pains - Humidified air, take hot showers with steam, inhaled steam from welding pot - Saline nasal spray Seasonal allergies Suspect underlying allergies given physical exam findings, current seasonal changes with significant pollen. - Fluticasone spray daily - Cetirizine 10 mg daily  Return if symptoms worsen or fail to improve.  [name] Sharion Dove, MD [facility]

## 2023-09-21 ENCOUNTER — Ambulatory Visit (INDEPENDENT_AMBULATORY_CARE_PROVIDER_SITE_OTHER): Admitting: Student

## 2023-09-21 VITALS — BP 101/60 | HR 80 | Ht 66.0 in | Wt 229.6 lb

## 2023-09-21 DIAGNOSIS — J302 Other seasonal allergic rhinitis: Secondary | ICD-10-CM

## 2023-09-21 DIAGNOSIS — J4521 Mild intermittent asthma with (acute) exacerbation: Secondary | ICD-10-CM

## 2023-09-21 MED ORDER — ALBUTEROL SULFATE (2.5 MG/3ML) 0.083% IN NEBU
2.5000 mg | INHALATION_SOLUTION | Freq: Once | RESPIRATORY_TRACT | Status: AC
Start: 2023-09-21 — End: 2023-09-21
  Administered 2023-09-21: 2.5 mg via RESPIRATORY_TRACT

## 2023-09-21 MED ORDER — BUDESONIDE-FORMOTEROL FUMARATE 160-4.5 MCG/ACT IN AERO
2.0000 | INHALATION_SPRAY | Freq: Two times a day (BID) | RESPIRATORY_TRACT | 3 refills | Status: DC
Start: 2023-09-21 — End: 2024-04-09

## 2023-09-21 MED ORDER — FLUTICASONE PROPIONATE 50 MCG/ACT NA SUSP
2.0000 | Freq: Every day | NASAL | 6 refills | Status: AC
Start: 2023-09-21 — End: ?

## 2023-09-21 MED ORDER — CETIRIZINE HCL 10 MG PO TABS
10.0000 mg | ORAL_TABLET | Freq: Every day | ORAL | 11 refills | Status: AC
Start: 2023-09-21 — End: ?

## 2023-09-21 MED ORDER — ALBUTEROL SULFATE HFA 108 (90 BASE) MCG/ACT IN AERS
1.0000 | INHALATION_SPRAY | Freq: Four times a day (QID) | RESPIRATORY_TRACT | 0 refills | Status: DC | PRN
Start: 2023-09-21 — End: 2024-04-09

## 2023-09-21 MED ORDER — IPRATROPIUM BROMIDE 0.02 % IN SOLN
0.5000 mg | Freq: Once | RESPIRATORY_TRACT | Status: AC
Start: 2023-09-21 — End: 2023-09-21
  Administered 2023-09-21: 0.5 mg via RESPIRATORY_TRACT

## 2023-09-21 NOTE — Progress Notes (Signed)
  SUBJECTIVE:   CHIEF COMPLAINT / HPI:   Elijah Sosa is a 23 year old male with asthma who presents with shortness of breath.  He experiences shortness of breath for one week, particularly at work, with associated coughing and chest tightness. His asthma, more problematic in childhood, is managed with an albuterol  inhaler that is nearly finished and expired, providing some relief. A day without the inhaler at work resulted in significant breathing difficulties and chest tightness. He experiences wheezing, especially at night, sometimes waking him up. He suspects allergies, with frequent sneezing and itchy eyes, particularly at night. No congestion is reported.  He stopped taking his allergy medications which are recommended at visit approximately 1 month ago because he started to feel better.  He states when he was a kid he did end up having to go to the hospital to have nebulizers but since then his asthma has generally not bothered him very much.  PERTINENT  PMH / PSH: Asthma  OBJECTIVE:  BP 101/60   Pulse 80   Ht 5\' 6"  (1.676 m)   Wt 229 lb 9.6 oz (104.1 kg)   SpO2 95%   BMI 37.06 kg/m  General: Well-appearing, NAD HEENT: Clear oropharynx, normal TMs with cerumen buildup in auditory canal CV: RRR, murmurs appreciable Pulm: Expiratory wheezing bilaterally, normal WOB; expiratory wheezing improved although tracely present after nebulizer  ASSESSMENT/PLAN:   Assessment & Plan Mild intermittent asthma with acute exacerbation Daytime symptoms, night awakening, necessity for [facility] reliever although not necessarily activity limitation therefore uncontrolled asthma with acute exacerbation meeting criteria for ICS/LABA.  May also consider given nighttime awakenings and BMI of 37 whether there is a sleep apnea component however given wheezing and prior history, asthma is higher on differential. - DuoNeb provided with moderate improvement - Refill for albuterol  to use 1 to 2  puffs every 6 hours as needed - Initiate Symbicort 160-4.5 mg 2 puffs twice daily - Return in 1 month for asthma follow-up, consider PFTs in the future Seasonal allergies Refill Zyrtec  and Flonase  for allergic symptoms and to assist with asthma control   Return in about 1 month (around 10/22/2023) for Asthma follow-up. Veronia Goon, DO 09/21/2023, 10:58 AM PGY-3, [facility]

## 2023-09-21 NOTE — Patient Instructions (Addendum)
 It was great to see you today! Thank you for choosing [facility] for your primary care.  Today we addressed: You are having an asthma exacerbation.  We gave you a nebulizer here.  I have refilled your albuterol  and allergy medications which you need to restart.  Only use the albuterol  as needed.  I have also put in for a new inhaler called Symbicort which is a combination steroid and long-acting of your albuterol  which is to be used as a controller medication and does need to be taken every day as directed.  I would like to see you back in 1 month to see how this has been working for you.  If you haven't already, sign up for My Chart to have easy access to your labs results, and communication with your primary care physician.  Return in about 1 month (around 10/22/2023) for Asthma follow-up. Please arrive 15 minutes before your appointment to ensure smooth check in process.  We appreciate your efforts in making this happen.  Thank you for allowing me to participate in your care, Veronia Goon, DO 09/21/2023, 10:58 AM PGY-3, [facility]

## 2023-09-21 NOTE — Assessment & Plan Note (Signed)
 Refill Zyrtec  and Flonase  for allergic symptoms and to assist with asthma control

## 2023-10-15 ENCOUNTER — Ambulatory Visit (INDEPENDENT_AMBULATORY_CARE_PROVIDER_SITE_OTHER): Admitting: Student

## 2023-10-15 VITALS — BP 112/75 | HR 74 | Temp 98.5°F | Ht 66.0 in | Wt 228.4 lb

## 2023-10-15 DIAGNOSIS — Z91038 Other insect allergy status: Secondary | ICD-10-CM | POA: Diagnosis not present

## 2023-10-15 MED ORDER — MUPIROCIN 2 % EX OINT
1.0000 | TOPICAL_OINTMENT | Freq: Two times a day (BID) | CUTANEOUS | 0 refills | Status: AC
Start: 2023-10-15 — End: ?

## 2023-10-15 NOTE — Patient Instructions (Addendum)
 It was great to see you today! Thank you for choosing [facility] for your primary care.  Today we addressed: I am prescribing you mupirocin to apply topically over the area.  Please let me know should you develop fever, there is discharge from the site, or this does not continue to heal.  If you haven't already, sign up for My Chart to have easy access to your labs results, and communication with your primary care physician.  Return if symptoms worsen or fail to improve. Please arrive 15 minutes before your appointment to ensure smooth check in process.  We appreciate your efforts in making this happen.  Thank you for allowing me to participate in your care, Veronia Goon, DO 10/15/2023, 3:01 PM PGY-3, [facility]

## 2023-10-15 NOTE — Progress Notes (Signed)
  SUBJECTIVE:   CHIEF COMPLAINT / HPI:   Elijah Sosa is a 23 year old male who presents with a painful and swollen ant bite on his right foot.  He was bitten by a small, possibly black and red ant on his right foot yesterday. Initially, the area was swollen and itchy. Today, there is increased pain, especially with downward movement of the foot. He applies ice for relief and has washed the area with soap. There is no drainage from the site. He experiences some difficulty walking due to pain. No fever is present at home.  When providing pictures of hands, patient most identified to be similar to carpenter ant rather than velvet ant.  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  Overall not greatly concerning, treat with mupirocin and return if not improving. Return if symptoms worsen or fail to improve. Veronia Goon, DO 10/15/2023, 3:01 PM PGY-3, [facility]

## 2023-10-23 ENCOUNTER — Encounter: Payer: Self-pay | Admitting: *Deleted

## 2024-04-09 ENCOUNTER — Ambulatory Visit: Payer: Self-pay | Admitting: Family Medicine

## 2024-04-09 ENCOUNTER — Ambulatory Visit: Admitting: Family Medicine

## 2024-04-09 ENCOUNTER — Encounter: Payer: Self-pay | Admitting: Family Medicine

## 2024-04-09 VITALS — BP 130/76 | HR 88 | Ht 66.0 in | Wt 227.6 lb

## 2024-04-09 DIAGNOSIS — Z1159 Encounter for screening for other viral diseases: Secondary | ICD-10-CM

## 2024-04-09 DIAGNOSIS — J4521 Mild intermittent asthma with (acute) exacerbation: Secondary | ICD-10-CM | POA: Diagnosis not present

## 2024-04-09 DIAGNOSIS — Z412 Encounter for routine and ritual male circumcision: Secondary | ICD-10-CM | POA: Diagnosis not present

## 2024-04-09 DIAGNOSIS — J45909 Unspecified asthma, uncomplicated: Secondary | ICD-10-CM | POA: Insufficient documentation

## 2024-04-09 DIAGNOSIS — Z6836 Body mass index (BMI) 36.0-36.9, adult: Secondary | ICD-10-CM | POA: Diagnosis not present

## 2024-04-09 LAB — POCT GLYCOSYLATED HEMOGLOBIN (HGB A1C): Hemoglobin A1C: 5.9 % — AB (ref 4.0–5.6)

## 2024-04-09 MED ORDER — ALBUTEROL SULFATE HFA 108 (90 BASE) MCG/ACT IN AERS
1.0000 | INHALATION_SPRAY | Freq: Four times a day (QID) | RESPIRATORY_TRACT | 0 refills | Status: AC | PRN
Start: 2024-04-09 — End: ?

## 2024-04-09 MED ORDER — BUDESONIDE-FORMOTEROL FUMARATE 160-4.5 MCG/ACT IN AERO
2.0000 | INHALATION_SPRAY | Freq: Two times a day (BID) | RESPIRATORY_TRACT | 3 refills | Status: AC
Start: 2024-04-09 — End: ?

## 2024-04-09 NOTE — Assessment & Plan Note (Signed)
 No wheezing today on exam. Refilled symbicort  and albuterol .

## 2024-04-09 NOTE — Progress Notes (Signed)
   SUBJECTIVE:   CHIEF COMPLAINT / HPI:  Discussed the use of AI scribe software for clinical note transcription with the patient, who gave verbal consent to proceed.  History of Present Illness Elijah Sosa is a 22 year old male who presents with concerns about foreskin-related issues and shortness of breath.  Foreskin and penile symptoms - Issues with urination, including sensation of residual urine after voiding - Occasional swelling of the foreskin - Intermittent pain associated with symptoms - Symptoms present for a couple of months off an on - Currently no pain, significant discomfort, swelling, or redness  Dyspnea - History of shortness of breath, allergies, asthma - Previously used an inhaler  - Discontinued inhaler use when symptoms improved - Shortness of breath has recurred - Requests refill of inhaler   OBJECTIVE:  BP 130/76   Pulse 88   Ht 5' 6 (1.676 m)   Wt 227 lb 9.6 oz (103.2 kg)   SpO2 99%   BMI 36.74 kg/m   Physical Exam GENERAL: Alert, cooperative, well developed, no acute distress. HEENT: Normocephalic, normal oropharynx, moist mucous membranes CHEST: Clear to auscultation bilaterally, no wheezes, rhonchi, or crackles. CARDIOVASCULAR: Normal heart rate and rhythm, S1 and S2 normal without murmurs, peripheral pulses intact. NEUROLOGICAL: Cranial nerves II-XII grossly intact, moves all extremities without gross motor or sensory deficit. GU: Declined given no symptoms today.  ASSESSMENT/PLAN:   Assessment & Plan Encounter for hepatitis C screening test for low risk patient Ordered HCV ab today. Need for hepatitis B screening test Ordered surface ab today. BMI 36.0-36.9,adult Ordered A1c and lipid panel. Encounter for circumcision Referred to urology. Mild intermittent asthma with acute exacerbation No wheezing today on exam. Refilled symbicort  and albuterol .  Stuart Redo, MD [facility]

## 2024-04-09 NOTE — Patient Instructions (Addendum)
 I have sent in the referral to urology. This can take up to 3-4 weeks.   I refilled your asthma medications.  We collected blood labs today.

## 2024-04-10 LAB — LIPID PANEL
Chol/HDL Ratio: 2.7 ratio (ref 0.0–5.0)
Cholesterol, Total: 126 mg/dL (ref 100–199)
HDL: 46 mg/dL (ref >39)
LDL Chol Calc (NIH): 56 mg/dL (ref 0–99)
Triglycerides: 136 mg/dL (ref 0–149)
VLDL Cholesterol Cal: 24 mg/dL (ref 5–40)

## 2024-04-10 LAB — HEPATITIS B SURFACE ANTIBODY,QUALITATIVE: Hep B Surface Ab, Qual: NONREACTIVE

## 2024-04-10 LAB — HCV INTERPRETATION

## 2024-04-10 LAB — HCV AB W REFLEX TO QUANT PCR: HCV Ab: NONREACTIVE

## 2024-04-14 ENCOUNTER — Telehealth: Payer: Self-pay

## 2024-04-14 ENCOUNTER — Other Ambulatory Visit (HOSPITAL_COMMUNITY): Payer: Self-pay

## 2024-04-14 NOTE — Telephone Encounter (Signed)
 Breyna  PA request rec'd from pharmacy.   Patients insurance covers Symbicort  (which was originally sent to pharmacy).   No PA needed. Will fax back to pharmacy.
